# Patient Record
Sex: Male | Born: 1940 | Race: Black or African American | Hispanic: No | Smoking: Former smoker
Health system: Southern US, Community
[De-identification: ages and names within clinical notes are randomized; demographics above are authoritative.]

## PROBLEM LIST (undated history)

## (undated) DIAGNOSIS — M1990 Unspecified osteoarthritis, unspecified site: Secondary | ICD-10-CM

## (undated) DIAGNOSIS — J40 Bronchitis, not specified as acute or chronic: Secondary | ICD-10-CM

## (undated) DIAGNOSIS — J45909 Unspecified asthma, uncomplicated: Secondary | ICD-10-CM

## (undated) DIAGNOSIS — I10 Essential (primary) hypertension: Secondary | ICD-10-CM

## (undated) DIAGNOSIS — C61 Malignant neoplasm of prostate: Secondary | ICD-10-CM

## (undated) DIAGNOSIS — J449 Chronic obstructive pulmonary disease, unspecified: Secondary | ICD-10-CM

## (undated) HISTORY — PX: CARDIAC SURGERY: SHX584

## (undated) HISTORY — PX: FINGER SURGERY: SHX640

---

## 1999-04-23 ENCOUNTER — Emergency Department (HOSPITAL_COMMUNITY): Admission: EM | Admit: 1999-04-23 | Discharge: 1999-04-23 | Payer: Self-pay | Admitting: Emergency Medicine

## 1999-05-02 ENCOUNTER — Emergency Department (HOSPITAL_COMMUNITY): Admission: EM | Admit: 1999-05-02 | Discharge: 1999-05-02 | Payer: Self-pay | Admitting: Emergency Medicine

## 2000-12-31 ENCOUNTER — Emergency Department (HOSPITAL_COMMUNITY): Admission: EM | Admit: 2000-12-31 | Discharge: 2001-01-01 | Payer: Self-pay | Admitting: Emergency Medicine

## 2001-01-01 ENCOUNTER — Encounter: Payer: Self-pay | Admitting: Emergency Medicine

## 2001-02-08 ENCOUNTER — Emergency Department (HOSPITAL_COMMUNITY): Admission: EM | Admit: 2001-02-08 | Discharge: 2001-02-09 | Payer: Self-pay | Admitting: Emergency Medicine

## 2001-02-08 ENCOUNTER — Encounter: Payer: Self-pay | Admitting: Emergency Medicine

## 2001-03-13 ENCOUNTER — Emergency Department (HOSPITAL_COMMUNITY): Admission: EM | Admit: 2001-03-13 | Discharge: 2001-03-13 | Payer: Self-pay

## 2002-03-25 ENCOUNTER — Encounter: Payer: Self-pay | Admitting: Emergency Medicine

## 2002-03-26 ENCOUNTER — Inpatient Hospital Stay (HOSPITAL_COMMUNITY): Admission: EM | Admit: 2002-03-26 | Discharge: 2002-03-28 | Payer: Self-pay | Admitting: Emergency Medicine

## 2002-03-26 ENCOUNTER — Encounter: Payer: Self-pay | Admitting: Internal Medicine

## 2002-10-25 ENCOUNTER — Ambulatory Visit (HOSPITAL_COMMUNITY): Admission: RE | Admit: 2002-10-25 | Discharge: 2002-10-25 | Payer: Self-pay | Admitting: Internal Medicine

## 2002-10-25 ENCOUNTER — Encounter: Payer: Self-pay | Admitting: Internal Medicine

## 2006-07-07 ENCOUNTER — Emergency Department (HOSPITAL_COMMUNITY): Admission: EM | Admit: 2006-07-07 | Discharge: 2006-07-07 | Payer: Self-pay | Admitting: Family Medicine

## 2006-09-13 ENCOUNTER — Emergency Department (HOSPITAL_COMMUNITY): Admission: EM | Admit: 2006-09-13 | Discharge: 2006-09-13 | Payer: Self-pay | Admitting: Emergency Medicine

## 2007-09-11 ENCOUNTER — Emergency Department (HOSPITAL_COMMUNITY): Admission: EM | Admit: 2007-09-11 | Discharge: 2007-09-11 | Payer: Self-pay | Admitting: Emergency Medicine
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
## 2010-08-21 NOTE — Discharge Summary (Signed)
NAME:  Anthony Duffy, Anthony Duffy NO.:  [id number]   MEDICAL RECORD NO.:  [id number]                   PATIENT TYPE:  INP   LOCATION:  5032                                 FACILITY:  [facility]   PHYSICIAN:  Rene Paci, M.D. [facility]          DATE OF BIRTH:  Nov 04, 1940   DATE OF ADMISSION:  03/25/2002  DATE OF DISCHARGE:  03/28/2002                                 DISCHARGE SUMMARY   ADMISSION DIAGNOSIS:  Chronic obstructive pulmonary disease exacerbation.   DISCHARGE DIAGNOSIS:  Chronic obstructive pulmonary disease exacerbation and  questionable pneumonia.   DISCHARGE LOCATION:  Prison.   DISCHARGE MEDICATIONS:  1. Prednisone taper: 60 mg on December 25, 26, and 37; 60 mg on December 28,     29, and 30; 40 mg on December 31, January 1, 2; 30 mg on January 3, 4,     and 5; 20 mg on January 6, 7, and 8; 10 mg on January 9, 10, and 11; 10     mg on January 13, and 15, then stop.  2. Ceftin 250 mg 1 p.o. b.i.d. for 5 more days to complete a 7-day course of     antibiotics.  3. He is to use his inhalers that he has at the prison as needed.  4. He is to return to his other home medications which includes verapamil     120 mg 1 p.o. q.d.   LABORATORY DATA:  Chest x-ray on 03/25/2002 showed COPD, no acute  abnormality, heart size and vascularity normal.  Lungs are somewhat  hyperinflated consistent with COPD.   Magnesium 2.1.  BMET on 03/26/2002 normal except for glucose of 120.  CBC on  December 22 normal except for slightly elevated neutrophil count at 89.  Lymphocytes show left shift at 8, and absolute lymphocytes also showing a  left shift at 0.5.   HOSPITAL COURSE:  The patient was placed on IV steroids, antibiotic therapy,  inhalation therapy, and he showed very good response to the above.  He  states that he believes his problems are exacerbated by a probable sick-  building issue where he feels better during the day when he is outside  working; when he  re-enters the building, he begins to cough and feel  congested.  This has progressed to the point where he required hospital.  At  point of discharge, he is much improved.  He is eager to be returning home  since he is on Christmas leave  from the prison.   PULMONARY, CHRONIC OBSTRUCTIVE PULMONARY DISEASE AND ASTHMATIC EXACERBATION:  Improved.  He will be discharged on prednisone taper as listed above,  guaifenesin, and antibiotics.  He can use the inhalers he had at home.   DISCHARGE PHYSICAL EXAMINATION:  VITAL SIGNS:  Temperature 97.4, pulse 91,  respiratory rate 20, blood pressure 120/82, O2 saturation 94%.  GENERAL:  There are no new laboratories  to report.  He is awake, alert, and  oriented x 4.  He is a pleasant man.  HEART:  Regular rate and rhythm.  LUNGS:  No crackles or wheezes.  Speech sounds somewhat nasal.  EXTREMITIES:  No edema.  ABDOMEN:  Flat.  He has good bowel sounds and is nontender.   DISPOSITION:  The patient will be discharged in the care of prison service.  He wishes to continue with [facility] on release from the hospital.  His mother  is a patient in the [place] system.     Governor Specking, N.P. [facility]             Rene Paci, M.D. [facility]    [facility]/MEDQ  D:  03/28/2002  T:  03/29/2002  Job:  [id number]

## 2011-09-21 ENCOUNTER — Emergency Department (INDEPENDENT_AMBULATORY_CARE_PROVIDER_SITE_OTHER)
Admission: EM | Admit: 2011-09-21 | Discharge: 2011-09-21 | Disposition: A | Discharge status: Home / Self Care | Payer: Medicare Other | Source: Home / Self Care | Attending: Emergency Medicine | Admitting: Emergency Medicine

## 2011-09-21 ENCOUNTER — Encounter (HOSPITAL_COMMUNITY): Payer: Self-pay

## 2011-09-21 DIAGNOSIS — J069 Acute upper respiratory infection, unspecified: Secondary | ICD-10-CM

## 2011-09-21 DIAGNOSIS — R030 Elevated blood-pressure reading, without diagnosis of hypertension: Secondary | ICD-10-CM

## 2011-09-21 LAB — POCT I-STAT, CHEM 8
BUN: 13 mg/dL (ref 6–23)
Creatinine, Ser: 1.1 mg/dL (ref 0.50–1.35)
Glucose, Bld: 84 mg/dL (ref 70–99)
Hemoglobin: 15.3 g/dL (ref 13.0–17.0)
Sodium: 141 mEq/L (ref 135–145)
TCO2: 26 mmol/L (ref 0–100)

## 2011-09-21 MED ORDER — CHLORTHALIDONE 25 MG PO TABS: 12.5000 mg | ORAL_TABLET | Freq: Every day | ORAL | Status: DC

## 2011-09-21 NOTE — ED Provider Notes (Signed)
Medical screening examination/treatment/procedure(s) were performed by non-physician practitioner and as supervising physician I was immediately available for consultation/collaboration.  Leslee Home, M.D.   Reuben Likes, MD 09/21/11 2102

## 2011-09-21 NOTE — ED Notes (Signed)
States he has not had an exam in some time, and is concerned about his BP that was elevated at a public use machine in drug store. States he has the sensation of a hair going across his forehead at times and some pressure sensation in the front of his head : NAD; has been told in past that he has cataracts

## 2011-09-21 NOTE — ED Provider Notes (Signed)
History     CSN: [id number]  Arrival date & time 09/21/11  1317   First MD Initiated Contact with Patient 09/21/11 1358      Chief Complaint  Patient presents with  . Headache    (Consider location/radiation/quality/duration/timing/severity/associated sxs/prior treatment) Patient is a 71 y.o. male presenting with headaches. The history is provided by the patient.  Headache The primary symptoms include headaches.  Patient states he checked blood pressure at drug store and noted bp reading to be elevated today.  States he is further concerned because he has had a headache that has been daily for the last three days.  Patient complains of a 3 day history of persistent headache. Character:  Pulsating  Location: frontal Aggravating activities:  none Alleviating activities:  BC powder and ibuprofen No history of injury.  Not worst headache of life.  Does not awaken from sleep.  -radiation, +dizziness, -syncope, -LOC, -diplopia, -loss of vision, -aura, -photophobia. No nausea/vomiting.  No extremity weakness, numbness, or paresthesias.  Wears glasses for known decreased visual acuity as result of cataracts.  Also reports nasal congestion in addition to intermittent cough.  History reviewed. No pertinent past medical history.  History reviewed. No pertinent past surgical history.  History reviewed. No pertinent family history.  History  Substance Use Topics  . Smoking status: Never Smoker   . Smokeless tobacco: Not on file  . Alcohol Use: No      Review of Systems  Neurological: Positive for headaches.  All other systems reviewed and are negative.    Allergies  Review of patient's allergies indicates no known allergies.  Home Medications   Current Outpatient Rx  Name Route Sig Dispense Refill  . CHLORTHALIDONE 25 MG PO TABS Oral Take 0.5 tablets (12.5 mg total) by mouth daily. 30 tablet 0    BP 154/71  Pulse 58  Temp 98.6 F (37 C) (Oral)  Resp 18  SpO2  99%  Physical Exam  Nursing note and vitals reviewed. Constitutional: He is oriented to person, place, and time. Vital signs are normal. He appears well-developed and well-nourished. He is active and cooperative.  Non-toxic appearance. He does not appear ill.  HENT:  Head: Normocephalic.  Right Ear: Hearing, external ear and ear canal normal. Tympanic membrane is bulging.  Left Ear: Hearing, tympanic membrane, external ear and ear canal normal.  Nose: Nose normal.  Mouth/Throat: Uvula is midline, oropharynx is clear and moist and mucous membranes are normal.  Eyes: Conjunctivae and EOM are normal. Pupils are equal, round, and reactive to light. No scleral icterus.  Neck: Trachea normal. Neck supple.  Cardiovascular: Normal rate, regular rhythm and normal heart sounds.   Pulmonary/Chest: Effort normal and breath sounds normal.  Lymphadenopathy:       Head (right side): No submental, no submandibular, no tonsillar, no preauricular, no posterior auricular and no occipital adenopathy present.       Head (left side): No submental, no submandibular, no tonsillar, no preauricular, no posterior auricular and no occipital adenopathy present.  Neurological: He is alert and oriented to person, place, and time. He has normal strength. No cranial nerve deficit or sensory deficit.  Skin: Skin is warm and dry.  Psychiatric: He has a normal mood and affect. His speech is normal and behavior is normal. Judgment and thought content normal. Cognition and memory are normal.    ED Course  Procedures (including critical care time)   Labs Reviewed  POCT I-STAT, CHEM 8   No results found.  1. Elevated blood pressure reading   2. URI (upper respiratory infection)       MDM  Follow up with primary care provider within two weeks to recheck your blood pressure, begin medication as prescribed.  Avoid adding sodium to food. You may use Claritin or Zyrtec for URI symtpoms.  RTC if you begin experiencing  chest pain, sob, nausea, or worsing headache.       Johnsie Kindred, NP 09/21/11 [phone]

## 2011-09-21 NOTE — Discharge Instructions (Signed)
Hypertension Information As your heart beats, it forces blood through your arteries. This force is your blood pressure. If the pressure is too high, it is called hypertension (HTN) or high blood pressure. HTN is dangerous because you may have it and not know it. High blood pressure may mean that your heart has to work harder to pump blood. Your arteries may be narrow or stiff. The extra work puts you at risk for heart disease, stroke, and other problems.  Blood pressure consists of two numbers, a higher number over a lower, 110/72, for example. It is stated as "110 over 72." The ideal is below 120 for the top number (systolic) and under 80 for the bottom (diastolic).  You should pay close attention to your blood pressure if you have certain conditions such as:  Heart failure.   Prior heart attack.   Diabetes   Chronic kidney disease.   Prior stroke.   Multiple risk factors for heart disease.  To see if you have HTN, your blood pressure should be measured while you are seated with your arm held at the level of the heart. It should be measured at least twice. A one-time elevated blood pressure reading (especially in the Emergency Department) does not mean that you need treatment. There may be conditions in which the blood pressure is different between your right and left arms. It is important to see your caregiver soon for a recheck. Most people have essential hypertension which means that there is not a specific cause. This type of high blood pressure may be lowered by changing lifestyle factors such as:  Stress.   Smoking.   Lack of exercise.   Excessive weight.   Drug/tobacco/alcohol use.   Eating less salt.  Most people do not have symptoms from high blood pressure until it has caused damage to the body. Effective treatment can often prevent, delay or reduce that damage. TREATMENT  Treatment for high blood pressure, when a cause has been identified, is directed at the cause. There  are a large number of medications to treat HTN. These fall into several categories, and your caregiver will help you select the medicines that are best for you. Medications may have side effects. You should review side effects with your caregiver. If your blood pressure stays high after you have made lifestyle changes or started on medicines,   Your medication(s) may need to be changed.   Other problems may need to be addressed.   Be certain you understand your prescriptions, and know how and when to take your medicine.   Be sure to follow up with your caregiver within the time frame advised (usually within two weeks) to have your blood pressure rechecked and to review your medications.   If you are taking more than one medicine to lower your blood pressure, make sure you know how and at what times they should be taken. Taking two medicines at the same time can result in blood pressure that is too low.  Document Released: 05/25/2005 Document Revised: 12/02/2010 Document Reviewed: 06/01/2007 [facility] Patient Information 2012 [place], [place].  Upper Respiratory Infection, Adult An upper respiratory infection (URI) is also sometimes known as the common cold. The upper respiratory tract includes the nose, sinuses, throat, trachea, and bronchi. Bronchi are the airways leading to the lungs. Most people improve within 1 week, but symptoms can last up to 2 weeks. A residual cough may last even longer.  CAUSES Many different viruses can infect the tissues lining the upper  respiratory tract. The tissues become irritated and inflamed and often become very moist. Mucus production is also common. A cold is contagious. You can easily spread the virus to others by oral contact. This includes kissing, sharing a glass, coughing, or sneezing. Touching your mouth or nose and then touching a surface, which is then touched by another person, can also spread the virus. SYMPTOMS  Symptoms typically develop 1 to 3 days  after you come in contact with a cold virus. Symptoms vary from person to person. They may include:  Runny nose.   Sneezing.   Nasal congestion.   Sinus irritation.   Sore throat.   Loss of voice (laryngitis).   Cough.   Fatigue.   Muscle aches.   Loss of appetite.   Headache.   Low-grade fever.  DIAGNOSIS  You might diagnose your own cold based on familiar symptoms, since most people get a cold 2 to 3 times a year. Your caregiver can confirm this based on your exam. Most importantly, your caregiver can check that your symptoms are not due to another disease such as strep throat, sinusitis, pneumonia, asthma, or epiglottitis. Blood tests, throat tests, and X-rays are not necessary to diagnose a common cold, but they may sometimes be helpful in excluding other more serious diseases. Your caregiver will decide if any further tests are required. RISKS AND COMPLICATIONS  You may be at risk for a more severe case of the common cold if you smoke cigarettes, have chronic heart disease (such as heart failure) or lung disease (such as asthma), or if you have a weakened immune system. The very young and very old are also at risk for more serious infections. Bacterial sinusitis, middle ear infections, and bacterial pneumonia can complicate the common cold. The common cold can worsen asthma and chronic obstructive pulmonary disease (COPD). Sometimes, these complications can require emergency medical care and may be life-threatening. PREVENTION  The best way to protect against getting a cold is to practice good hygiene. Avoid oral or hand contact with people with cold symptoms. Wash your hands often if contact occurs. There is no clear evidence that vitamin C, vitamin E, echinacea, or exercise reduces the chance of developing a cold. However, it is always recommended to get plenty of rest and practice good nutrition. TREATMENT  Treatment is directed at relieving symptoms. There is no cure.  Antibiotics are not effective, because the infection is caused by a virus, not by bacteria. Treatment may include:  Increased fluid intake. Sports drinks offer valuable electrolytes, sugars, and fluids.   Breathing heated mist or steam (vaporizer or shower).   Eating chicken soup or other clear broths, and maintaining good nutrition.   Getting plenty of rest.   Using gargles or lozenges for comfort.   Controlling fevers with ibuprofen or acetaminophen as directed by your caregiver.   Increasing usage of your inhaler if you have asthma.  Zinc gel and zinc lozenges, taken in the first 24 hours of the common cold, can shorten the duration and lessen the severity of symptoms. Pain medicines may help with fever, muscle aches, and throat pain. A variety of non-prescription medicines are available to treat congestion and runny nose. Your caregiver can make recommendations and may suggest nasal or lung inhalers for other symptoms.  HOME CARE INSTRUCTIONS   Only take over-the-counter or prescription medicines for pain, discomfort, or fever as directed by your caregiver.   Use a warm mist humidifier or inhale steam from a shower to increase  air moisture. This may keep secretions moist and make it easier to breathe.   Drink enough water and fluids to keep your urine clear or pale yellow.   Rest as needed.   Return to work when your temperature has returned to normal or as your caregiver advises. You may need to stay home longer to avoid infecting others. You can also use a face mask and careful hand washing to prevent spread of the virus.  SEEK MEDICAL CARE IF:   After the first few days, you feel you are getting worse rather than better.   You need your caregiver's advice about medicines to control symptoms.   You develop chills, worsening shortness of breath, or brown or red sputum. These may be signs of pneumonia.   You develop yellow or brown nasal discharge or pain in the face,  especially when you bend forward. These may be signs of sinusitis.   You develop a fever, swollen neck glands, pain with swallowing, or white areas in the back of your throat. These may be signs of strep throat.  SEEK IMMEDIATE MEDICAL CARE IF:   You have a fever.   You develop severe or persistent headache, ear pain, sinus pain, or chest pain.   You develop wheezing, a prolonged cough, cough up blood, or have a change in your usual mucus (if you have chronic lung disease).   You develop sore muscles or a stiff neck.  Document Released: 09/15/2000 Document Revised: 03/11/2011 Document Reviewed: 07/24/2010 [facility] Patient Information 2012 [place], [place].Upper Respiratory Infection, Adult An upper respiratory infection (URI) is also sometimes known as the common cold. The upper respiratory tract includes the nose, sinuses, throat, trachea, and bronchi. Bronchi are the airways leading to the lungs. Most people improve within 1 week, but symptoms can last up to 2 weeks. A residual cough may last even longer.  CAUSES Many different viruses can infect the tissues lining the upper respiratory tract. The tissues become irritated and inflamed and often become very moist. Mucus production is also common. A cold is contagious. You can easily spread the virus to others by oral contact. This includes kissing, sharing a glass, coughing, or sneezing. Touching your mouth or nose and then touching a surface, which is then touched by another person, can also spread the virus. SYMPTOMS  Symptoms typically develop 1 to 3 days after you come in contact with a cold virus. Symptoms vary from person to person. They may include:  Runny nose.   Sneezing.   Nasal congestion.   Sinus irritation.   Sore throat.   Loss of voice (laryngitis).   Cough.   Fatigue.   Muscle aches.   Loss of appetite.   Headache.   Low-grade fever.  DIAGNOSIS  You might diagnose your own cold based on familiar symptoms,  since most people get a cold 2 to 3 times a year. Your caregiver can confirm this based on your exam. Most importantly, your caregiver can check that your symptoms are not due to another disease such as strep throat, sinusitis, pneumonia, asthma, or epiglottitis. Blood tests, throat tests, and X-rays are not necessary to diagnose a common cold, but they may sometimes be helpful in excluding other more serious diseases. Your caregiver will decide if any further tests are required. RISKS AND COMPLICATIONS  You may be at risk for a more severe case of the common cold if you smoke cigarettes, have chronic heart disease (such as heart failure) or lung disease (such as asthma),  or if you have a weakened immune system. The very young and very old are also at risk for more serious infections. Bacterial sinusitis, middle ear infections, and bacterial pneumonia can complicate the common cold. The common cold can worsen asthma and chronic obstructive pulmonary disease (COPD). Sometimes, these complications can require emergency medical care and may be life-threatening. PREVENTION  The best way to protect against getting a cold is to practice good hygiene. Avoid oral or hand contact with people with cold symptoms. Wash your hands often if contact occurs. There is no clear evidence that vitamin C, vitamin E, echinacea, or exercise reduces the chance of developing a cold. However, it is always recommended to get plenty of rest and practice good nutrition. TREATMENT  Treatment is directed at relieving symptoms. There is no cure. Antibiotics are not effective, because the infection is caused by a virus, not by bacteria. Treatment may include:  Increased fluid intake. Sports drinks offer valuable electrolytes, sugars, and fluids.   Breathing heated mist or steam (vaporizer or shower).   Eating chicken soup or other clear broths, and maintaining good nutrition.   Getting plenty of rest.   Using gargles or lozenges for  comfort.   Controlling fevers with ibuprofen or acetaminophen as directed by your caregiver.   Increasing usage of your inhaler if you have asthma.  Zinc gel and zinc lozenges, taken in the first 24 hours of the common cold, can shorten the duration and lessen the severity of symptoms. Pain medicines may help with fever, muscle aches, and throat pain. A variety of non-prescription medicines are available to treat congestion and runny nose. Your caregiver can make recommendations and may suggest nasal or lung inhalers for other symptoms.  HOME CARE INSTRUCTIONS   Only take over-the-counter or prescription medicines for pain, discomfort, or fever as directed by your caregiver.   Use a warm mist humidifier or inhale steam from a shower to increase air moisture. This may keep secretions moist and make it easier to breathe.   Drink enough water and fluids to keep your urine clear or pale yellow.   Rest as needed.   Return to work when your temperature has returned to normal or as your caregiver advises. You may need to stay home longer to avoid infecting others. You can also use a face mask and careful hand washing to prevent spread of the virus.  SEEK MEDICAL CARE IF:   After the first few days, you feel you are getting worse rather than better.   You need your caregiver's advice about medicines to control symptoms.   You develop chills, worsening shortness of breath, or brown or red sputum. These may be signs of pneumonia.   You develop yellow or brown nasal discharge or pain in the face, especially when you bend forward. These may be signs of sinusitis.   You develop a fever, swollen neck glands, pain with swallowing, or white areas in the back of your throat. These may be signs of strep throat.  SEEK IMMEDIATE MEDICAL CARE IF:   You have a fever.   You develop severe or persistent headache, ear pain, sinus pain, or chest pain.   You develop wheezing, a prolonged cough, cough up  blood, or have a change in your usual mucus (if you have chronic lung disease).   You develop sore muscles or a stiff neck.  Document Released: 09/15/2000 Document Revised: 03/11/2011 Document Reviewed: 07/24/2010 [facility] Patient Information 2012 [place], [place].

## 2011-11-22 ENCOUNTER — Emergency Department (INDEPENDENT_AMBULATORY_CARE_PROVIDER_SITE_OTHER): Payer: Medicare Other

## 2011-11-22 ENCOUNTER — Emergency Department (INDEPENDENT_AMBULATORY_CARE_PROVIDER_SITE_OTHER)
Admission: EM | Admit: 2011-11-22 | Discharge: 2011-11-22 | Disposition: A | Discharge status: Home / Self Care | Payer: Medicare Other | Source: Home / Self Care

## 2011-11-22 ENCOUNTER — Encounter (HOSPITAL_COMMUNITY): Payer: Self-pay

## 2011-11-22 DIAGNOSIS — M25572 Pain in left ankle and joints of left foot: Secondary | ICD-10-CM

## 2011-11-22 DIAGNOSIS — M25579 Pain in unspecified ankle and joints of unspecified foot: Secondary | ICD-10-CM

## 2011-11-22 HISTORY — DX: Essential (primary) hypertension: I10

## 2011-11-22 MED ORDER — CHLORTHALIDONE 25 MG PO TABS: 12.5000 mg | ORAL_TABLET | Freq: Every day | ORAL | Status: DC
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
## 2011-12-20 ENCOUNTER — Other Ambulatory Visit: Payer: Self-pay | Admitting: Ophthalmology

## 2011-12-20 NOTE — H&P (Signed)
  Pre-operative History and Physical for Ophthalmic Surgery  Anthony Duffy 12/20/2011                  Chief Complaint: Decreased vision Right Eye  Diagnosis: Combioned Cataract Right Eye  No Known Allergies   Prior to Admission medications   Medication Sig Start Date End Date Taking? Authorizing Provider  chlorthalidone (HYGROTON) 25 MG tablet Take 0.5 tablets (12.5 mg total) by mouth daily. 11/22/11 11/21/12  Jai-Gurmukh Samtani, MD    Planned Procedure:                                       Phacoemulsification, Posterior Chamber Intra-ocular Lens Right Eye  There were no vitals filed for this visit.  Pulse: 70         Temp: NE        Resp:  18       ROS: negative apart form blurred vision OD  Past Medical History  Diagnosis Date  . Hypertension     No past surgical history on file.   History   Social History  . Marital Status: Legally Separated    Spouse Name: N/A    Number of Children: N/A  . Years of Education: N/A   Occupational History  . Not on file.   Social History Main Topics  . Smoking status: Never Smoker   . Smokeless tobacco: Not on file  . Alcohol Use: No  . Drug Use: No  . Sexually Active:    Other Topics Concern  . Not on file   Social History Narrative  . No narrative on file     The following examination is for anesthesia clearance for minimally invasive Ophthalmic surgery. It is primarily to document heart and lung findings and is not intended to elucidate unknown general medical conditions inclusive of abdominal masses, lung lesions, etc.   General Constitution:  within normal limits   Alertness/Orientation:  Person, time place     yes   HEENT:  Eye Findings:  Combined Cataract                   right eye  Neck: supple without masses  Chest/Lungs: clear to auscultation  Cardiac: Normal S1 and S2 without Murmur, S3 or S4  Neuro: non-focal  Impression:  Combined Cataract Right Eye  Planned Procedure:  Phacoemulsification,  Posterior Chamber Intraocular Lens Right Eye   Amaliya Whitelaw, MD        

## 2011-12-28 NOTE — Pre-Procedure Instructions (Signed)
20 Anthony Duffy  12/28/2011   Your procedure is scheduled on:  Wednesday 01/05/12   Report to [facility] at 1230 PM.  Call this number if you have problems the morning of surgery: [phone]   Remember:   Do not eat food OR DRINK :After Midnight.    Take these medicines the morning of surgery with A SIP OF WATER: no medications   Do not wear jewelry, make-up or nail polish.  Do not wear lotions, powders, or perfumes.  Do not shave 48 hours prior to surgery. Men may shave face and neck.  Do not bring valuables to the hospital.  Contacts, dentures or bridgework may not be worn into surgery.  Leave suitcase in the car. After surgery it may be brought to your room.  For patients admitted to the hospital, checkout time is 11:00 AM the day of discharge.   Patients discharged the day of surgery will not be allowed to drive home.  Name and phone number of your driver: Doyce Loose  Special Instructions: Shower using CHG 2 nights before surgery and the night before surgery.  If you shower the day of surgery use CHG.  Use special wash - you have one bottle of CHG for all showers.  You should use approximately 1/3 of the bottle for each shower.   Please read over the following fact sheets that you were given: Pain Booklet, Coughing and Deep Breathing, MRSA Information and Surgical Site Infection Prevention

## 2011-12-29 ENCOUNTER — Encounter (HOSPITAL_COMMUNITY): Payer: Self-pay

## 2011-12-29 ENCOUNTER — Encounter (HOSPITAL_COMMUNITY)
Admission: RE | Admit: 2011-12-29 | Discharge: 2011-12-29 | Disposition: A | Discharge status: Home / Self Care | Payer: Medicare Other | Source: Ambulatory Visit | Attending: Ophthalmology | Admitting: Ophthalmology

## 2011-12-29 ENCOUNTER — Emergency Department (INDEPENDENT_AMBULATORY_CARE_PROVIDER_SITE_OTHER)
Admission: EM | Admit: 2011-12-29 | Discharge: 2011-12-29 | Disposition: A | Discharge status: Home / Self Care | Payer: Medicare Other | Source: Home / Self Care

## 2011-12-29 ENCOUNTER — Encounter (HOSPITAL_COMMUNITY): Payer: Self-pay | Admitting: Emergency Medicine

## 2011-12-29 DIAGNOSIS — J069 Acute upper respiratory infection, unspecified: Secondary | ICD-10-CM

## 2011-12-29 DIAGNOSIS — B360 Pityriasis versicolor: Secondary | ICD-10-CM

## 2011-12-29 DIAGNOSIS — J9801 Acute bronchospasm: Secondary | ICD-10-CM

## 2011-12-29 HISTORY — DX: Unspecified asthma, uncomplicated: J45.909

## 2011-12-29 HISTORY — DX: Bronchitis, not specified as acute or chronic: J40

## 2011-12-29 HISTORY — DX: Unspecified osteoarthritis, unspecified site: M19.90

## 2011-12-29 LAB — BASIC METABOLIC PANEL
BUN: 19 mg/dL (ref 6–23)
Creatinine, Ser: 1 mg/dL (ref 0.50–1.35)
GFR calc non Af Amer: 74 mL/min — ABNORMAL LOW (ref >90)
Glucose, Bld: 109 mg/dL — ABNORMAL HIGH (ref 70–99)
Potassium: 4.1 mEq/L (ref 3.5–5.1)

## 2011-12-29 LAB — CBC
HCT: 44.5 % (ref 39.0–52.0)
Hemoglobin: 14.9 g/dL (ref 13.0–17.0)
MCH: 28.2 pg (ref 26.0–34.0)
MCHC: 33.5 g/dL (ref 30.0–36.0)
MCV: 84.3 fL (ref 78.0–100.0)
Platelets: 286 10*3/uL (ref 150–400)
RBC: 5.28 MIL/uL (ref 4.22–5.81)
RDW: 14 % (ref 11.5–15.5)
WBC: 7.5 10*3/uL (ref 4.0–10.5)

## 2011-12-29 MED ORDER — TRIAMCINOLONE ACETONIDE 40 MG/ML IJ SUSP
40.0000 mg | Freq: Once | INTRAMUSCULAR | Status: AC
  Administered 2011-12-29: 40 mg via INTRAMUSCULAR

## 2011-12-29 MED ORDER — ALBUTEROL SULFATE (5 MG/ML) 0.5% IN NEBU
2.5000 mg | INHALATION_SOLUTION | Freq: Once | RESPIRATORY_TRACT | Status: AC
  Administered 2011-12-29: 2.5 mg via RESPIRATORY_TRACT

## 2011-12-29 MED ORDER — IPRATROPIUM BROMIDE 0.02 % IN SOLN
0.5000 mg | Freq: Once | RESPIRATORY_TRACT | Status: AC
  Administered 2011-12-29: 0.5 mg via RESPIRATORY_TRACT

## 2011-12-29 MED ORDER — ALBUTEROL SULFATE (2.5 MG/3ML) 0.083% IN NEBU: 2.5000 mg | INHALATION_SOLUTION | Freq: Four times a day (QID) | RESPIRATORY_TRACT | Status: DC | PRN

## 2011-12-29 MED ORDER — PHENYLEPHRINE-CHLORPHEN-DM 10-4-12.5 MG/5ML PO LIQD: 5.0000 mL | ORAL | Status: DC | PRN

## 2011-12-29 MED ORDER — TRIAMCINOLONE ACETONIDE 40 MG/ML IJ SUSP
INTRAMUSCULAR | Status: AC
  Filled 2011-12-29: qty 5

## 2011-12-29 MED ORDER — SELENIUM SULFIDE 2.5 % EX LOTN: TOPICAL_LOTION | CUTANEOUS | Status: DC

## 2011-12-29 MED ORDER — METHYLPREDNISOLONE 4 MG PO KIT: PACK | ORAL | Status: DC

## 2011-12-29 MED ORDER — ALBUTEROL SULFATE (5 MG/ML) 0.5% IN NEBU
INHALATION_SOLUTION | RESPIRATORY_TRACT | Status: AC
  Filled 2011-12-29: qty 1

## 2011-12-29 MED ORDER — ALBUTEROL SULFATE HFA 108 (90 BASE) MCG/ACT IN AERS: 2.0000 | INHALATION_SPRAY | RESPIRATORY_TRACT | Status: DC | PRN

## 2011-12-29 NOTE — ED Provider Notes (Signed)
History     CSN: [id number]  Arrival date & time 12/29/11  0907   None     Chief Complaint  Patient presents with  . URI    (Consider location/radiation/quality/duration/timing/severity/associated sxs/prior treatment) HPI Comments: Is oscillating the room he also pointed out type of rash on his left upper extremity and back. He has had this for several years off and on. These are deep pigmented annular flat lesions consistent with tinea versicolor  Patient is a 71 y.o. male presenting with URI. The history is provided by the patient.  URI The primary symptoms include fever, cough and wheezing. Primary symptoms do not include headaches, ear pain, sore throat, swollen glands, abdominal pain, nausea, vomiting, myalgias, arthralgias or rash. The current episode started 3 to 5 days ago. The problem has not changed since onset. Symptoms associated with the illness include congestion and rhinorrhea. The illness is not associated with chills or plugged ear sensation. Risk factors for severe complications from URI include being elderly and chronic respiratory disease.    Past Medical History  Diagnosis Date  . Hypertension   . Asthma     as child  . Bronchitis   . Arthritis     Past Surgical History  Procedure Date  . Finger surgery     s/p injury 30 years ago    No family history on file.  History  Substance Use Topics  . Smoking status: Former Games developer  . Smokeless tobacco: Not on file   Comment: stopped 15 years ago  . Alcohol Use: No     stopped 15 years ago      Review of Systems  Constitutional: Positive for fever. Negative for chills.  HENT: Positive for congestion and rhinorrhea. Negative for ear pain and sore throat.   Respiratory: Positive for cough and wheezing.   Gastrointestinal: Negative.  Negative for nausea, vomiting and abdominal pain.  Genitourinary: Negative.   Musculoskeletal: Negative for myalgias and arthralgias.  Skin: Negative for rash.    Neurological: Negative for headaches.    Allergies  Review of patient's allergies indicates no known allergies.  Home Medications   Current Outpatient Rx  Name Route Sig Dispense Refill  . CHLORTHALIDONE 25 MG PO TABS Oral Take 0.5 tablets (12.5 mg total) by mouth daily. 30 tablet 1  . OVER THE COUNTER MEDICATION  Cough medicine    . ALBUTEROL SULFATE HFA 108 (90 BASE) MCG/ACT IN AERS Inhalation Inhale 2 puffs into the lungs every 4 (four) hours as needed for wheezing. 1 Inhaler 1  . ALBUTEROL SULFATE (2.5 MG/3ML) 0.083% IN NEBU Nebulization Take 3 mLs (2.5 mg total) by nebulization every 6 (six) hours as needed for wheezing. 75 mL 12  . METHYLPREDNISOLONE 4 MG PO KIT  Take as directed 21 tablet 0  . PHENYLEPHRINE-CHLORPHEN-DM 01-07-11.5 MG/5ML PO LIQD Oral Take 5 mLs by mouth every other day as needed. 120 mL 0  . SELENIUM SULFIDE 2.5 % EX LOTN Topical Apply topically 2 (two) times a week. 120 mL 1    BP 134/81  Pulse 76  Temp 98.1 F (36.7 C) (Oral)  Resp 16  SpO2 96%  Physical Exam  Constitutional: He is oriented to person, place, and time. He appears well-developed and well-nourished. No distress.  HENT:  Right Ear: External ear normal.  Left Ear: External ear normal.  Mouth/Throat: Oropharynx is clear and moist. No oropharyngeal exudate.       Clear PND  Eyes: Conjunctivae normal and EOM are normal. Pupils  are equal, round, and reactive to light.  Neck: Normal range of motion. Neck supple.  Cardiovascular: Normal rate and regular rhythm.   Pulmonary/Chest: Effort normal. No respiratory distress. He has wheezes.       Tight wheezes bilaterally with prolonged expiratory phase  Musculoskeletal: Normal range of motion. He exhibits no edema.  Lymphadenopathy:    He has no cervical adenopathy.  Neurological: He is alert and oriented to person, place, and time.  Skin: Skin is warm and dry. Rash noted.  Psychiatric: He has a normal mood and affect.    ED Course   Procedures (including critical care time)  Labs Reviewed - No data to display Dg Chest 2 View  12/29/2011  *RADIOLOGY REPORT*  Clinical Data: Preoperative examination (cataract surgery)  CHEST - 2 VIEW  Comparison: None available.  Findings:  Normal cardiac silhouette and mediastinal contours. The lungs appear hyperinflated with flattening of bilateral hemidiaphragms. Minimal heterogeneous opacities within the right middle lobe, possibly atelectasis or scar.  There is mild eventration of the medial aspect of the right hemidiaphragm.  No pleural effusion or pneumothorax.  There is apparent loss/obliteration of the T7 - T8 intervertebral disc space of uncertain chronicity.  IMPRESSION: 1.  Hyperexpanded lungs without acute cardiopulmonary disease. 2.  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 Comparison to remote examinations (if available) is recommended.  Correlation for point tenderness at this location is recommended.  Further evaluation with dedicated thoracic spine radiographs may be performed as clinically indicated.  This was made a call report.   Original Report Authenticated By: Waynard Reeds, M.D.      1. URI (upper respiratory infection)   2. Bronchospasm   3. Tinea versicolor       MDM   Medrol Dosepak as directed .Albuterol HFA 2 puffs every 4-6 hours when necessary  cough or wheeze. -year-old solution for his nebulizer. Selenium sulfide to apply 2 times a week. Norell CS 1 teaspoon every 4 hours when necessary cough and Congestion           Hayden Rasmussen, NP 12/29/11 1137

## 2011-12-29 NOTE — Progress Notes (Signed)
Forwarded EKG and CXR to [facility] for review.  Patient states does not have a primary care physician.  Unable to obtain an EKG for comparison.

## 2011-12-29 NOTE — ED Notes (Signed)
Uri symptoms: cough, congestion, stuffy nose

## 2011-12-29 NOTE — ED Notes (Signed)
States he feels better after the [facility] treatment

## 2011-12-29 NOTE — ED Notes (Signed)
Onalee Hua, np at bedside assessing patient

## 2011-12-30 NOTE — Consult Note (Signed)
Anesthesia chart review: Patient is a 71 year old male scheduled for right eye cataract extraction on 01/05/2012 by Dr. Clarisa Kindred. History includes former smoker, hypertension, arthritis, asthma, bronchitis.  Currently, he is receiving "primary care" through the ED or urgent care.   Labs noted.  CXR report on 12/29/11 showed: "1. Hyperexpanded lungs without acute cardiopulmonary disease.  2. 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 Comparison to remote examinations (if available) is recommended. Correlation for point tenderness at this location is recommended. Further evaluation with dedicated thoracic spine radiographs may be performed as clinically indicated. This was made a call report." (Dr. Simonne Come) Defer further followo-up recommendations to the ordering physician (Dr. Clarisa Kindred).  EKG on 12/29/11 showed NSR with sinus arrhythmia, cannot rule out anterior infarct, age undetermined (r wave progression is reversed in V2 and V3), borderline LAD.  Currently, there are no comparison EKGs available.  No CV symptoms were documented at his PAT visit.  Reviewed with Anesthesiologist Dr. Ivin Booty who agrees if patient remains asymptomatic then plan to proceed.  Shonna Chock, PA-C

## 2011-12-30 NOTE — ED Provider Notes (Signed)
Medical screening examination/treatment/procedure(s) were performed by non-physician practitioner and as supervising physician I was immediately available for consultation/collaboration.   [facility]; MD   Sharin Grave, MD 12/30/11 1227

## 2012-01-04 NOTE — Progress Notes (Signed)
I spoke with patient and informed him of schesule change - pt to arrive at 0915.  Pt repeated time to me.

## 2012-01-04 NOTE — Progress Notes (Signed)
Spoke with patient and notified him to be here at 10:30 am instead of 12:30 pm.

## 2012-01-05 ENCOUNTER — Ambulatory Visit (HOSPITAL_COMMUNITY): Payer: Medicare Other | Admitting: Vascular Surgery

## 2012-01-05 ENCOUNTER — Encounter (HOSPITAL_COMMUNITY): Payer: Self-pay | Admitting: Vascular Surgery

## 2012-01-05 ENCOUNTER — Encounter (HOSPITAL_COMMUNITY)
Admission: RE | Disposition: A | Discharge status: Home / Self Care | Payer: Self-pay | Source: Ambulatory Visit | Attending: Ophthalmology

## 2012-01-05 ENCOUNTER — Encounter (HOSPITAL_COMMUNITY): Payer: Self-pay | Admitting: *Deleted

## 2012-01-05 ENCOUNTER — Ambulatory Visit (HOSPITAL_COMMUNITY)
Admission: RE | Admit: 2012-01-05 | Discharge: 2012-01-05 | Disposition: A | Discharge status: Home / Self Care | Payer: Medicare Other | Source: Ambulatory Visit | Attending: Ophthalmology | Admitting: Ophthalmology

## 2012-01-05 DIAGNOSIS — H2510 Age-related nuclear cataract, unspecified eye: Secondary | ICD-10-CM | POA: Insufficient documentation

## 2012-01-05 DIAGNOSIS — Z01818 Encounter for other preprocedural examination: Secondary | ICD-10-CM | POA: Insufficient documentation

## 2012-01-05 DIAGNOSIS — Z01812 Encounter for preprocedural laboratory examination: Secondary | ICD-10-CM | POA: Insufficient documentation

## 2012-01-05 DIAGNOSIS — Z01810 Encounter for preprocedural cardiovascular examination: Secondary | ICD-10-CM | POA: Insufficient documentation

## 2012-01-05 DIAGNOSIS — I10 Essential (primary) hypertension: Secondary | ICD-10-CM | POA: Insufficient documentation

## 2012-01-05 HISTORY — PX: CATARACT EXTRACTION W/PHACO: SHX586

## 2012-01-05 SURGERY — PHACOEMULSIFICATION, CATARACT, WITH IOL INSERTION
Anesthesia: Monitor Anesthesia Care | Site: Eye | Laterality: Right | Wound class: Clean

## 2012-01-05 MED ORDER — ACETYLCHOLINE CHLORIDE 1:100 IO SOLR
INTRAOCULAR | Status: AC
  Filled 2012-01-05: qty 1

## 2012-01-05 MED ORDER — SODIUM CHLORIDE 0.9 % IV SOLN
INTRAVENOUS | Status: DC
  Administered 2012-01-05: 11:00:00 via INTRAVENOUS

## 2012-01-05 MED ORDER — BSS IO SOLN
INTRAOCULAR | Status: AC
  Filled 2012-01-05: qty 500

## 2012-01-05 MED ORDER — CEFAZOLIN SUBCONJUNCTIVAL INJECTION 100 MG/0.5 ML
200.0000 mg | INJECTION | SUBCONJUNCTIVAL | Status: AC
  Administered 2012-01-05: 200 mg via SUBCONJUNCTIVAL
  Filled 2012-01-05: qty 1

## 2012-01-05 MED ORDER — GATIFLOXACIN 0.5 % OP SOLN
1.0000 [drp] | OPHTHALMIC | Status: AC | PRN
  Administered 2012-01-05 (×3): 1 [drp] via OPHTHALMIC
  Filled 2012-01-05: qty 2.5

## 2012-01-05 MED ORDER — HYPROMELLOSE (GONIOSCOPIC) 2.5 % OP SOLN
OPHTHALMIC | Status: AC
  Filled 2012-01-05: qty 15

## 2012-01-05 MED ORDER — LIDOCAINE HCL (CARDIAC) 20 MG/ML IV SOLN
INTRAVENOUS | Status: DC | PRN
  Administered 2012-01-05: 20 mg via INTRAVENOUS

## 2012-01-05 MED ORDER — PROPOFOL 10 MG/ML IV BOLUS
INTRAVENOUS | Status: DC | PRN
  Administered 2012-01-05: 60 mg via INTRAVENOUS

## 2012-01-05 MED ORDER — DEXAMETHASONE SODIUM PHOSPHATE 10 MG/ML IJ SOLN
INTRAMUSCULAR | Status: DC | PRN
  Administered 2012-01-05: 10 mg via INTRAVENOUS

## 2012-01-05 MED ORDER — EPINEPHRINE HCL 1 MG/ML IJ SOLN
INTRAMUSCULAR | Status: AC
  Filled 2012-01-05: qty 1

## 2012-01-05 MED ORDER — BACITRACIN-POLYMYXIN B 500-10000 UNIT/GM OP OINT
TOPICAL_OINTMENT | OPHTHALMIC | Status: DC | PRN
  Administered 2012-01-05: 1 via OPHTHALMIC

## 2012-01-05 MED ORDER — BUPIVACAINE HCL 0.75 % IJ SOLN
INTRAMUSCULAR | Status: DC | PRN
  Administered 2012-01-05: 10 mL

## 2012-01-05 MED ORDER — ACETYLCHOLINE CHLORIDE 1:100 IO SOLR
INTRAOCULAR | Status: DC | PRN
  Administered 2012-01-05: 20 mg via INTRAOCULAR

## 2012-01-05 MED ORDER — TETRACAINE HCL 0.5 % OP SOLN
1.0000 [drp] | OPHTHALMIC | Status: AC
  Administered 2012-01-05: 1 [drp] via OPHTHALMIC
  Filled 2012-01-05: qty 2

## 2012-01-05 MED ORDER — HYPROMELLOSE (GONIOSCOPIC) 2.5 % OP SOLN
OPHTHALMIC | Status: DC | PRN
  Administered 2012-01-05: 2 [drp] via OPHTHALMIC

## 2012-01-05 MED ORDER — SODIUM HYALURONATE 10 MG/ML IO SOLN
INTRAOCULAR | Status: AC
  Filled 2012-01-05: qty 0.85

## 2012-01-05 MED ORDER — PREDNISOLONE ACETATE 1 % OP SUSP
1.0000 [drp] | OPHTHALMIC | Status: AC
  Administered 2012-01-05: 1 [drp] via OPHTHALMIC
  Filled 2012-01-05: qty 5

## 2012-01-05 MED ORDER — NA CHONDROIT SULF-NA HYALURON 40-30 MG/ML IO SOLN
INTRAOCULAR | Status: DC | PRN
  Administered 2012-01-05 (×2): 0.5 mL via INTRAOCULAR

## 2012-01-05 MED ORDER — PHENYLEPHRINE HCL 2.5 % OP SOLN
1.0000 [drp] | OPHTHALMIC | Status: AC | PRN
  Administered 2012-01-05 (×3): 1 [drp] via OPHTHALMIC
  Filled 2012-01-05: qty 3

## 2012-01-05 MED ORDER — NA CHONDROIT SULF-NA HYALURON 40-30 MG/ML IO SOLN
INTRAOCULAR | Status: AC
  Filled 2012-01-05: qty 0.5

## 2012-01-05 MED ORDER — BALANCED SALT IO SOLN
INTRAOCULAR | Status: DC | PRN
  Administered 2012-01-05: 15 mL via INTRAOCULAR

## 2012-01-05 MED ORDER — WATER FOR IRRIGATION, STERILE IR SOLN
Status: DC | PRN
  Administered 2012-01-05: 1000 mL

## 2012-01-05 MED ORDER — BACITRACIN-POLYMYXIN B 500-10000 UNIT/GM OP OINT
TOPICAL_OINTMENT | OPHTHALMIC | Status: AC
  Filled 2012-01-05: qty 3.5

## 2012-01-05 MED ORDER — TRIAMCINOLONE ACETONIDE 40 MG/ML IJ SUSP
INTRAMUSCULAR | Status: AC
  Filled 2012-01-05: qty 1

## 2012-01-05 MED ORDER — LIDOCAINE HCL 2 % IJ SOLN
INTRAMUSCULAR | Status: AC
  Filled 2012-01-05: qty 20

## 2012-01-05 MED ORDER — EPINEPHRINE HCL 1 MG/ML IJ SOLN
INTRAOCULAR | Status: DC | PRN
  Administered 2012-01-05: 11:00:00

## 2012-01-05 MED ORDER — LIDOCAINE HCL (PF) 2 % IJ SOLN
INTRAMUSCULAR | Status: DC | PRN
  Administered 2012-01-05: 20 mL

## 2012-01-05 MED ORDER — SODIUM CHLORIDE 0.9 % IV SOLN
INTRAVENOUS | Status: DC | PRN
  Administered 2012-01-05: 11:00:00 via INTRAVENOUS

## 2012-01-05 MED ORDER — DEXAMETHASONE SODIUM PHOSPHATE 10 MG/ML IJ SOLN
INTRAMUSCULAR | Status: AC
  Filled 2012-01-05: qty 1

## 2012-01-05 MED ORDER — PROVISC 10 MG/ML IO SOLN
INTRAOCULAR | Status: DC | PRN
  Administered 2012-01-05: .85 mL via INTRAOCULAR

## 2012-01-05 MED ORDER — BUPIVACAINE HCL 0.75 % IJ SOLN
INTRAMUSCULAR | Status: AC
  Filled 2012-01-05: qty 10

## 2012-01-05 SURGICAL SUPPLY — 59 items
APPLICATOR COTTON TIP 6IN STRL (MISCELLANEOUS) ×2 IMPLANT
APPLICATOR DR MATTHEWS STRL (MISCELLANEOUS) ×2 IMPLANT
BAG MINI COLL DRAIN (WOUND CARE) ×2 IMPLANT
BLADE EYE MINI 60D BEAVER (BLADE) IMPLANT
BLADE KERATOME 2.75 (BLADE) ×2 IMPLANT
BLADE STAB KNIFE 15DEG (BLADE) IMPLANT
CANNULA ANTERIOR CHAMBER 27GA (MISCELLANEOUS) IMPLANT
CLOTH BEACON ORANGE TIMEOUT ST (SAFETY) ×2 IMPLANT
DRAPE OPHTHALMIC 77X100 STRL (CUSTOM PROCEDURE TRAY) ×2 IMPLANT
DRAPE POUCH INSTRU U-SHP 10X18 (DRAPES) ×2 IMPLANT
DRSG TEGADERM 4X4.75 (GAUZE/BANDAGES/DRESSINGS) ×2 IMPLANT
FILTER BLUE MILLIPORE (MISCELLANEOUS) IMPLANT
GLOVE BIO SURGEON STRL SZ7.5 (GLOVE) ×2 IMPLANT
GLOVE BIOGEL PI IND STRL 7.5 (GLOVE) ×2 IMPLANT
GLOVE BIOGEL PI INDICATOR 7.5 (GLOVE) ×2
GLOVE SS BIOGEL STRL SZ 6.5 (GLOVE) ×1 IMPLANT
GLOVE SUPERSENSE BIOGEL SZ 6.5 (GLOVE) ×1
GOWN SRG XL XLNG 56XLVL 4 (GOWN DISPOSABLE) ×1 IMPLANT
GOWN STRL NON-REIN LRG LVL3 (GOWN DISPOSABLE) ×4 IMPLANT
GOWN STRL NON-REIN XL XLG LVL4 (GOWN DISPOSABLE) ×1
KIT BASIN OR (CUSTOM PROCEDURE TRAY) ×2 IMPLANT
KIT ROOM TURNOVER OR (KITS) ×2 IMPLANT
KNIFE GRIESHABER SHARP 2.5MM (MISCELLANEOUS) ×2 IMPLANT
LENS INTRAOCULAR MA50BM 20.0 (Intraocular Lens) ×2 IMPLANT
MASK EYE SHIELD (GAUZE/BANDAGES/DRESSINGS) ×2 IMPLANT
NEEDLE 18GX1X1/2 (RX/OR ONLY) (NEEDLE) ×2 IMPLANT
NEEDLE 22X1 1/2 (OR ONLY) (NEEDLE) ×2 IMPLANT
NEEDLE 25GX 5/8IN NON SAFETY (NEEDLE) ×2 IMPLANT
NEEDLE FILTER BLUNT 18X 1/2SAF (NEEDLE) ×1
NEEDLE FILTER BLUNT 18X1 1/2 (NEEDLE) ×1 IMPLANT
NEEDLE HYPO 30X.5 LL (NEEDLE) ×4 IMPLANT
NS IRRIG 1000ML POUR BTL (IV SOLUTION) ×2 IMPLANT
PACK CATARACT CUSTOM (CUSTOM PROCEDURE TRAY) ×2 IMPLANT
PACK CATARACT MCHSCP (PACKS) ×2 IMPLANT
PACK COMBINED CATERACT/VIT 23G (OPHTHALMIC RELATED) IMPLANT
PAD ARMBOARD 7.5X6 YLW CONV (MISCELLANEOUS) ×4 IMPLANT
PAD EYE OVAL STERILE LF (GAUZE/BANDAGES/DRESSINGS) ×2 IMPLANT
PHACO TIP KELMAN 45DEG (TIP) ×2 IMPLANT
PROBE ANTERIOR 20G W/INFUS NDL (MISCELLANEOUS) IMPLANT
ROLLS DENTAL (MISCELLANEOUS) IMPLANT
SHUTTLE MONARCH TYPE A (NEEDLE) ×2 IMPLANT
SOLUTION ANTI FOG 6CC (MISCELLANEOUS) ×2 IMPLANT
SPEAR EYE SURG WECK-CEL (MISCELLANEOUS) ×2 IMPLANT
SUT ETHILON 10-0 CS-B-6CS-B-6 (SUTURE)
SUT ETHILON 5 0 P 3 18 (SUTURE)
SUT ETHILON 9 0 TG140 8 (SUTURE) IMPLANT
SUT NYLON ETHILON 5-0 P-3 1X18 (SUTURE) IMPLANT
SUT PLAIN 6 0 TG1408 (SUTURE) IMPLANT
SUT POLY NON ABSORB 10-0 8 STR (SUTURE) IMPLANT
SUT VICRYL 6 0 S 29 12 (SUTURE) IMPLANT
SUTURE EHLN 10-0 CS-B-6CS-B-6 (SUTURE) IMPLANT
SYR 20CC LL (SYRINGE) IMPLANT
SYR 5ML LL (SYRINGE) IMPLANT
SYR TB 1ML LUER SLIP (SYRINGE) ×2 IMPLANT
SYRINGE 10CC LL (SYRINGE) ×2 IMPLANT
TAPE PAPER MEDFIX 1IN X 10YD (GAUZE/BANDAGES/DRESSINGS) ×2 IMPLANT
TOWEL OR 17X24 6PK STRL BLUE (TOWEL DISPOSABLE) ×4 IMPLANT
WATER STERILE IRR 1000ML POUR (IV SOLUTION) ×2 IMPLANT
WIPE INSTRUMENT VISIWIPE 73X73 (MISCELLANEOUS) ×2 IMPLANT

## 2012-01-05 NOTE — Transfer of Care (Signed)
Immediate Anesthesia Transfer of Care Note  Patient: Anthony Duffy  Procedure(s) Performed: Procedure(s) (LRB) with comments: CATARACT EXTRACTION PHACO AND INTRAOCULAR LENS PLACEMENT (IOC) (Right)  Patient Location: Short Stay  Anesthesia Type: MAC  Level of Consciousness: awake, alert  and oriented  Airway & Oxygen Therapy: Patient Spontanous Breathing  Post-op Assessment: Report given to PACU RN  Post vital signs: Reviewed and stable  Complications: No apparent anesthesia complications

## 2012-01-05 NOTE — Anesthesia Postprocedure Evaluation (Signed)
  Anesthesia Post-op Note  Patient: Anthony Duffy  Procedure(s) Performed: Procedure(s) (LRB) with comments: CATARACT EXTRACTION PHACO AND INTRAOCULAR LENS PLACEMENT (IOC) (Right)  Patient Location: Short Stay  Anesthesia Type: MAC  Level of Consciousness: awake, alert  and oriented  Airway and Oxygen Therapy: Patient Spontanous Breathing  Post-op Pain: none  Post-op Assessment: Post-op Vital signs reviewed  Post-op Vital Signs: Reviewed  Complications: No apparent anesthesia complications

## 2012-01-05 NOTE — Op Note (Signed)
Anthony Duffy 01/05/2012 Cataract: Combined, Nuclear  Procedure: Phacoemulsification, Posterior Chamber Intra-ocular Lens Operative Eye:  right eye  Surgeon: Shade Flood Estimated Blood Loss: minimal Specimens for Pathology:  None Complications: none  The patient was prepared and draped in the usual manner for ocular surgery on the right eye. A Cook lid speculum was placed. A peripheral clear corneal incision was made at the surgical limbus centered at the 11:00 meridian. A separate clear corneal stab incision was made with a 15 degree blade at the 2:00 meridian to permit bi-manual technique. Provisc was instilled into the anterior chamber through that incision.  A keratome was used to create a self sealing incision entering the anterior chamber at the 11:00 meridian. A capsulorhexis was performed using a bent 25g needle. The lens was hydrodissected and the nucleus was hydrodilineated using a Nichammin cannula. The Chang chopper was inserted and used to rotate the lens to insure adequate lens mobility. The phacoemulsification handpiece was inserted and a combined phaco-chop technique was employed, fracturing the lens into separate sections with subsequent removal with the phaco handpiece.   The I/A cannula was used to remove remaining lens cortex. Provisc was instilled and used to deepen the anterior chamber and posterior capsule bag. The Monarch injector was used to place a folded Acrysof [id number] PC IOL, + 20.00  diopters, into the capsule bag. A Sinskey lens hook was used to dial in the trailing haptic.  The I/A cannula was used to remove the viscoelastic from the anterior chamber. BSS was used to bring IOP to the desired range and the wound was checked to insure it was watertight. Subconjunctival injections of Ance 100/0.80ml and Dexamethasone 4mg /62ml were placed without complication. The lid speculum and drapes were removed and the patient's eye was patched with Polymixin/Bacitracin ophthalmic  ointment. An eye shield was placed and the patient was transferred alert and conversant from the operating room to the post-operative recovery area.   Shade Flood, MD

## 2012-01-05 NOTE — Interval H&P Note (Signed)
History and Physical Interval Note:  01/05/2012 10:59 AM  Anthony Duffy  has presented today for surgery, with the diagnosis of Combined Cataract Left Eye  The various methods of treatment have been discussed with the patient and family. After consideration of risks, benefits and other options for treatment, the patient has consented to  Procedure(s) (LRB) with comments: CATARACT EXTRACTION PHACO AND INTRAOCULAR LENS PLACEMENT (IOC) (Right) as a surgical intervention .  The patient's history has been reviewed, patient examined, no change in status, stable for surgery.  I have reviewed the patient's chart and labs.  Questions were answered to the patient's satisfaction.     Shade Flood, MD

## 2012-01-05 NOTE — H&P (View-Only) (Signed)
  Pre-operative History and Physical for Ophthalmic Surgery  Anthony Duffy 12/20/2011                  Chief Complaint: Decreased vision Right Eye  Diagnosis: Combioned Cataract Right Eye  No Known Allergies   Prior to Admission medications   Medication Sig Start Date End Date Taking? Authorizing Provider  chlorthalidone (HYGROTON) 25 MG tablet Take 0.5 tablets (12.5 mg total) by mouth daily. 11/22/11 11/21/12  Rhetta Mura, MD    Planned Procedure:                                       Phacoemulsification, Posterior Chamber Intra-ocular Lens Right Eye  There were no vitals filed for this visit.  Pulse: 70         Temp: NE        Resp:  18       ROS: negative apart form blurred vision OD  Past Medical History  Diagnosis Date  . Hypertension     No past surgical history on file.   History   Social History  . Marital Status: Legally Separated    Spouse Name: N/A    Number of Children: N/A  . Years of Education: N/A   Occupational History  . Not on file.   Social History Main Topics  . Smoking status: Never Smoker   . Smokeless tobacco: Not on file  . Alcohol Use: No  . Drug Use: No  . Sexually Active:    Other Topics Concern  . Not on file   Social History Narrative  . No narrative on file     The following examination is for anesthesia clearance for minimally invasive Ophthalmic surgery. It is primarily to document heart and lung findings and is not intended to elucidate unknown general medical conditions inclusive of abdominal masses, lung lesions, etc.   General Constitution:  within normal limits   Alertness/Orientation:  Person, time place     yes   HEENT:  Eye Findings:  Combined Cataract                   right eye  Neck: supple without masses  Chest/Lungs: clear to auscultation  Cardiac: Normal S1 and S2 without Murmur, S3 or S4  Neuro: non-focal  Impression:  Combined Cataract Right Eye  Planned Procedure:  Phacoemulsification,  Posterior Chamber Intraocular Lens Right [name], MD

## 2012-01-05 NOTE — Anesthesia Preprocedure Evaluation (Addendum)
Anesthesia Evaluation  Patient identified by MRN, date of birth, ID band Patient awake    Reviewed: Allergy & Precautions, H&P , NPO status , Patient's Chart, lab work & pertinent test results  History of Anesthesia Complications Negative for: history of anesthetic complications  Airway Mallampati: I TM Distance: >3 FB Neck ROM: Full    Dental  (+) Dental Advisory Given, Missing and Poor Dentition   Pulmonary asthma ,    Pulmonary exam normal       Cardiovascular hypertension, Pt. on medications     Neuro/Psych negative neurological ROS     GI/Hepatic negative GI ROS, Neg liver ROS,   Endo/Other  negative endocrine ROS  Renal/GU negative Renal ROS     Musculoskeletal   Abdominal   Peds  Hematology   Anesthesia Other Findings   Reproductive/Obstetrics                          Anesthesia Physical Anesthesia Plan  ASA: III  Anesthesia Plan: MAC   Post-op Pain Management:    Induction: Intravenous  Airway Management Planned: Simple Face Mask  Additional Equipment:   Intra-op Plan:   Post-operative Plan:   Informed Consent: I have reviewed the patients History and Physical, chart, labs and discussed the procedure including the risks, benefits and alternatives for the proposed anesthesia with the patient or authorized representative who has indicated his/her understanding and acceptance.   Dental advisory given  Plan Discussed with: CRNA, Anesthesiologist and Surgeon  Anesthesia Plan Comments:        Anesthesia Quick Evaluation

## 2012-01-06 ENCOUNTER — Encounter (HOSPITAL_COMMUNITY): Payer: Self-pay | Admitting: Ophthalmology

## 2012-01-06 NOTE — Anesthesia Postprocedure Evaluation (Signed)
Anesthesia Post Note  Patient: Anthony Duffy  Procedure(s) Performed: Procedure(s) (LRB): CATARACT EXTRACTION PHACO AND INTRAOCULAR LENS PLACEMENT (IOC) (Right)  Anesthesia type: MAC  Patient location: PACU  Post pain: Pain level controlled  Post assessment: Patient's Cardiovascular Status Stable  Last Vitals:  Filed Vitals:   01/05/12 1231  BP: 134/86  Pulse: 54  Temp: 36.8 C  Resp: 16    Post vital signs: Reviewed and stable  Level of consciousness: sedated  Complications: No apparent anesthesia complications

## 2012-02-11 ENCOUNTER — Emergency Department (INDEPENDENT_AMBULATORY_CARE_PROVIDER_SITE_OTHER)
Admission: EM | Admit: 2012-02-11 | Discharge: 2012-02-11 | Disposition: A | Discharge status: Home / Self Care | Payer: Medicare Other | Source: Home / Self Care

## 2012-02-11 ENCOUNTER — Encounter (HOSPITAL_COMMUNITY): Payer: Self-pay | Admitting: Emergency Medicine

## 2012-02-11 DIAGNOSIS — J45901 Unspecified asthma with (acute) exacerbation: Secondary | ICD-10-CM

## 2012-02-11 DIAGNOSIS — J069 Acute upper respiratory infection, unspecified: Secondary | ICD-10-CM

## 2012-02-11 MED ORDER — PREDNISONE 20 MG PO TABS: 60.0000 mg | ORAL_TABLET | Freq: Every day | ORAL | Status: DC

## 2012-02-11 MED ORDER — IPRATROPIUM-ALBUTEROL 0.5-2.5 (3) MG/3ML IN SOLN: 3.0000 mL | Freq: Four times a day (QID) | RESPIRATORY_TRACT | Status: DC

## 2012-02-11 MED ORDER — ALBUTEROL SULFATE (5 MG/ML) 0.5% IN NEBU
INHALATION_SOLUTION | RESPIRATORY_TRACT | Status: AC
  Filled 2012-02-11: qty 1

## 2012-02-11 MED ORDER — IPRATROPIUM BROMIDE 0.02 % IN SOLN
0.5000 mg | RESPIRATORY_TRACT | Status: DC
  Administered 2012-02-11: 0.5 mg via RESPIRATORY_TRACT

## 2012-02-11 MED ORDER — AZITHROMYCIN 250 MG PO TABS: ORAL_TABLET | ORAL | Status: DC

## 2012-02-11 MED ORDER — ALBUTEROL SULFATE (5 MG/ML) 0.5% IN NEBU
2.5000 mg | INHALATION_SOLUTION | RESPIRATORY_TRACT | Status: DC
  Administered 2012-02-11: 2.5 mg via RESPIRATORY_TRACT

## 2012-02-11 NOTE — ED Provider Notes (Signed)
CC:  Chest congestion, hard to breath  HPI 71 y.o. male with cough starting one week ago. Increased phlegm - clear. Short of breath. Wheezing. No chest pain. Symptoms worse at night. No cold symptoms - no nasal congestion or drainage. Throat itching. No ear pain/pressure. Low grade fever last night with sweats (subjective). Has tried mucinex, [facility] cough syrup, help some. Has albuterol nebulizer and inhaler and has used both a few times a day - helps temporarily. No sick contacts. Treated with steroids and bronchodilators in September. Pt states he thinks he has been told he might have emphysema. He does not have PCP and has generally been healthy so doesn't see doctor regularly. 30 pack-year hx smoking though has not smoked in 20 years.  CXR in September showed hyperinflation but no acute cardiopulmonary abnl.  Past Medical History  Diagnosis Date  . Hypertension   . Asthma     as child  . Bronchitis   . Arthritis    Past Surgical History  Procedure Date  . Finger surgery     s/p injury 30 years ago  . Cataract extraction w/phaco 01/05/2012    Procedure: CATARACT EXTRACTION PHACO AND INTRAOCULAR LENS PLACEMENT (IOC);  Surgeon: Shade Flood, MD;  Location: [facility] OR;  Service: Ophthalmology;  Laterality: Right;   Smoked more than 20 years ago, no EtOH  Filed Vitals:   02/11/12 1742  BP: 177/88  Pulse: 85  Temp: 98.8 F (37.1 C)  Resp: 20  O2 sat 91% on arrival, 94-95 at time of exam   PHYSICAL EXAM Gen:  Alert and oriented, pleasant older gentleman, looks stated age, thin. HEENT:  PERRL, conjunctiva normal, EOMI, no frontal/maxillary tenderness. External nares normal. OP - mild erythema/cobblestoning, clear mucous. External ears, canals and TMs normal bilaterally Neck:  No LAD CV:  RRR, no murmur Lungs:  Diffuse wheezing, no crackles, good air movement with prolonged expiratory phase. No retractions. No distress. Regular rate.  Neuro: no focal deficits Extrem:  Warm, well  perfused  PROGRESS/TREATMENTS  Neb treatment with albuterol and ipratropium given. Subjectively improved. Still diffuse wheezing but improved. O2 sat still 94-96%.  A/P 71 y.o. male with acute exacerbation of likely chronic asthma or COPD - Albuterol q 4 hours, predisone, azithromycin - Elevated BP - on chlorthalidone. Needs PCP for f/u.  Napoleon Form, MD 02/11/2012 6:59 PM    Napoleon Form, MD 02/11/12 2202

## 2012-02-11 NOTE — ED Notes (Signed)
Reports a "bad cold"  "can't breathe"  Symptoms for more than a week.  Patient sounds stuffy with talking. Has cough

## 2012-02-12 ENCOUNTER — Telehealth (HOSPITAL_COMMUNITY): Payer: Self-pay | Admitting: Family Medicine

## 2012-02-12 MED ORDER — IPRATROPIUM-ALBUTEROL 0.5-2.5 (3) MG/3ML IN SOLN: 3.0000 mL | Freq: Four times a day (QID) | RESPIRATORY_TRACT | Status: DC | PRN

## 2012-02-12 NOTE — ED Notes (Signed)
Pharmacy called stating that there was missing information of the DUONEB RX for Medicare part B to cover.  Original RX canceled, new RX with missing info sent to pharmacy.  Pharmacy called again stating there was still missing info.  She stated that we could change the RX to [employer].  Dr Thad Ranger reviewed chart again and stated that substitution would be okay.

## 2013-05-18 ENCOUNTER — Encounter (HOSPITAL_COMMUNITY): Payer: Self-pay | Admitting: Emergency Medicine

## 2013-05-18 ENCOUNTER — Emergency Department (INDEPENDENT_AMBULATORY_CARE_PROVIDER_SITE_OTHER): Payer: Medicare Other

## 2013-05-18 ENCOUNTER — Emergency Department (INDEPENDENT_AMBULATORY_CARE_PROVIDER_SITE_OTHER)
Admission: EM | Admit: 2013-05-18 | Discharge: 2013-05-18 | Disposition: A | Discharge status: Home / Self Care | Payer: Medicare Other | Source: Home / Self Care | Attending: Family Medicine | Admitting: Family Medicine

## 2013-05-18 DIAGNOSIS — J45901 Unspecified asthma with (acute) exacerbation: Secondary | ICD-10-CM

## 2013-05-18 MED ORDER — IPRATROPIUM-ALBUTEROL 0.5-2.5 (3) MG/3ML IN SOLN
3.0000 mL | RESPIRATORY_TRACT | Status: DC
  Administered 2013-05-18: 3 mL via RESPIRATORY_TRACT

## 2013-05-18 MED ORDER — TRAMADOL HCL 50 MG PO TABS: 50.0000 mg | ORAL_TABLET | Freq: Every evening | ORAL | Status: DC | PRN

## 2013-05-18 MED ORDER — IPRATROPIUM-ALBUTEROL 0.5-2.5 (3) MG/3ML IN SOLN
RESPIRATORY_TRACT | Status: AC
  Filled 2013-05-18: qty 3

## 2013-05-18 MED ORDER — ALBUTEROL SULFATE (2.5 MG/3ML) 0.083% IN NEBU: 2.5000 mg | INHALATION_SOLUTION | Freq: Four times a day (QID) | RESPIRATORY_TRACT | Status: DC | PRN

## 2013-05-18 MED ORDER — PREDNISONE 50 MG PO TABS: 50.0000 mg | ORAL_TABLET | Freq: Every day | ORAL | Status: DC

## 2013-05-18 NOTE — ED Provider Notes (Signed)
Anthony Duffy is a 73 y.o. male who presents to Urgent Care today for cough and congestion. Patient has had a productive cough associated with mild shortness of breath and night sweats for 1.5 weeks. He has tried over-the-counter medications which have not helped much. Does not smoke chest congestion. He denies any significant wheezing. No current fevers or chills. He is well otherwise.   Past Medical History  Diagnosis Date  . Hypertension   . Asthma     as child  . Bronchitis   . Arthritis    History  Substance Use Topics  . Smoking status: Former Research scientist (life sciences)  . Smokeless tobacco: Not on file     Comment: stopped 15 years ago  . Alcohol Use: No     Comment: stopped 15 years ago   ROS as above Medications: Current Facility-Administered Medications  Medication Dose Route Frequency Provider Last Rate Last Dose  . ipratropium-albuterol (DUONEB) 0.5-2.5 (3) MG/3ML nebulizer solution 3 mL  3 mL Nebulization Q4H Anthony Hams, MD   3 mL at 05/18/13 1901   Current Outpatient Prescriptions  Medication Sig Dispense Refill  . albuterol (PROVENTIL HFA;VENTOLIN HFA) 108 (90 BASE) MCG/ACT inhaler Inhale 2 puffs into the lungs every 4 (four) hours as needed for wheezing.  1 Inhaler  1  . albuterol (PROVENTIL) (2.5 MG/3ML) 0.083% nebulizer solution Take 3 mLs (2.5 mg total) by nebulization every 6 (six) hours as needed for wheezing.  75 mL  1  . chlorthalidone (HYGROTON) 25 MG tablet Take 0.5 tablets (12.5 mg total) by mouth daily.  30 tablet  1  . ipratropium-albuterol (DUONEB) 0.5-2.5 (3) MG/3ML SOLN Take 3 mLs by nebulization every 6 (six) hours as needed.  360 mL  0  . methylPREDNISolone (MEDROL DOSEPAK) 4 MG tablet Take as directed  21 tablet  0  . OVER THE COUNTER MEDICATION Cough medicine      . Phenylephrine-Chlorphen-DM 01-07-11.5 MG/5ML LIQD Take 5 mLs by mouth every other day as needed.  120 mL  0  . predniSONE (DELTASONE) 50 MG tablet Take 1 tablet (50 mg total) by mouth daily.  5 tablet  0   . selenium sulfide (SELSUN) 2.5 % shampoo Apply topically 2 (two) times a week.  120 mL  1  . traMADol (ULTRAM) 50 MG tablet Take 1 tablet (50 mg total) by mouth at bedtime as needed (cough).  10 tablet  0    Exam:  BP 153/79  Pulse 75  Temp(Src) 98.7 F (37.1 C) (Oral)  Resp 16  SpO2 96% Gen: Well NAD  HEENT: EOMI,  MMM posterior pharynx is normal-appearing. Tympanic membranes are normal appearing bilaterally. Lungs: Normal work of breathing. Prolonged expiratory phase and wheezing present bilaterally. Rhonchi left lung. Heart: RRR no MRG Abd: NABS, Soft. NT, ND Exts: Brisk capillary refill, warm and well perfused.   Patient was given a DuoNeb nebulizer treatment and had considerable improvement in symptoms  No results found for this or any previous visit (from the past 24 hour(s)). Dg Chest 2 View  05/18/2013   CLINICAL DATA:  Chest congestion.  Cough.  EXAM: CHEST  2 VIEW  COMPARISON:  Chest x-ray 12/29/2011.  FINDINGS: Lung volumes are normal. No consolidative airspace disease. No pleural effusions. No pneumothorax. No pulmonary nodule or mass noted. Pulmonary vasculature and the cardiomediastinal silhouette are within normal limits.  IMPRESSION: 1.  No radiographic evidence of acute cardiopulmonary disease.   Electronically Signed   By: Anthony Duffy M.D.   On:  05/18/2013 18:25    Assessment and Plan: 73 y.o. male with asthma or COPD exacerbation. Possibly related to a viral illness. No evidence of pneumonia based on chest x-ray. Plan to treat with prednisone and albuterol. Additionally we'll use tramadol for cough control. Followup with primary care provider as needed.  Discussed warning signs or symptoms. Please see discharge instructions. Patient expresses understanding.    Anthony Hams, MD 05/18/13 [name]

## 2013-05-18 NOTE — ED Notes (Signed)
C/o  Chest congestion.  Productive cough with white sputum.  Sob.  Headache off/on.  Night sweats.  X 1 1/2 wk.  No relief with otc. Denies n/v/d

## 2013-05-18 NOTE — Discharge Instructions (Signed)
Thank you for coming in today. Use albuterol as needed.  Take prednisone daily for 5 days.  Use tramadol for cough as needed.  Follow up with your doctor.  Call or go to the emergency room if you get worse, have trouble breathing, have chest pains, or palpitations.   Asthma, Acute Bronchospasm Acute bronchospasm caused by asthma is also referred to as an asthma attack. Bronchospasm means your air passages become narrowed. The narrowing is caused by inflammation and tightening of the muscles in the air tubes (bronchi) in your lungs. This can make it hard to breath or cause you to wheeze and cough. CAUSES Possible triggers are:  Animal dander from the skin, hair, or feathers of animals.  Dust mites contained in house dust.  Cockroaches.  Pollen from trees or grass.  Mold.  Cigarette or tobacco smoke.  Air pollutants such as dust, household cleaners, hair sprays, aerosol sprays, paint fumes, strong chemicals, or strong odors.  Cold air or weather changes. Cold air may trigger inflammation. Winds increase molds and pollens in the air.  Strong emotions such as crying or laughing hard.  Stress.  Certain medicines such as aspirin or beta-blockers.  Sulfites in foods and drinks, such as dried fruits and wine.  Infections or inflammatory conditions, such as a flu, cold, or inflammation of the nasal membranes (rhinitis).  Gastroesophageal reflux disease (GERD). GERD is a condition where stomach acid backs up into your throat (esophagus).  Exercise or strenuous activity. SIGNS AND SYMPTOMS   Wheezing.  Excessive coughing, particularly at night.  Chest tightness.  Shortness of breath. DIAGNOSIS  Your health care provider will ask you about your medical history and perform a physical exam. A chest X-ray or blood testing may be performed to look for other causes of your symptoms or other conditions that may have triggered your asthma attack. TREATMENT  Treatment is aimed at  reducing inflammation and opening up the airways in your lungs. Most asthma attacks are treated with inhaled medicines. These include quick relief or rescue medicines (such as bronchodilators) and controller medicines (such as inhaled corticosteroids). These medicines are sometimes given through an inhaler or a nebulizer. Systemic steroid medicine taken by mouth or given through an IV tube also can be used to reduce the inflammation when an attack is moderate or severe. Antibiotic medicines are only used if a bacterial infection is present.  HOME CARE INSTRUCTIONS   Rest.  Drink plenty of liquids. This helps the mucus to remain thin and be easily coughed up. Only use caffeine in moderation and do not use alcohol until you have recovered from your illness.  Do not smoke. Avoid being exposed to secondhand smoke.  You play a critical role in keeping yourself in good health. Avoid exposure to things that cause you to wheeze or to have breathing problems.  Keep your medicines up to date and available. Carefully follow your health care provider's treatment plan.  Take your medicine exactly as prescribed.  When pollen or pollution is bad, keep windows closed and use an air conditioner or go to places with air conditioning.  Asthma requires careful medical care. See your health care provider for a follow-up as advised. If you are more than [redacted] weeks pregnant and you were prescribed any new medicines, let your obstetrician know about the visit and how you are doing. Follow-up with your health care provider as directed.  After you have recovered from your asthma attack, make an appointment with your outpatient doctor to  talk about ways to reduce the likelihood of future attacks. If you do not have a doctor who manages your asthma, make an appointment with a primary care doctor to discuss your asthma. SEEK IMMEDIATE MEDICAL CARE IF:   You are getting worse.  You have trouble breathing. If severe, call  your local emergency services (911 in the [place]  You develop chest pain or discomfort.  You are vomiting.  You are not able to keep fluids down.  You are coughing up yellow, green, brown, or bloody sputum.  You have a fever and your symptoms suddenly get worse.  You have trouble swallowing. MAKE SURE YOU:   Understand these instructions.  Will watch your condition.  Will get help right away if you are not doing well or get worse. Document Released: 07/07/2006 Document Revised: 11/22/2012 Document Reviewed: 09/27/2012 [facility] Patient Information 2014 [place], [place].

## 2013-06-21 ENCOUNTER — Emergency Department (INDEPENDENT_AMBULATORY_CARE_PROVIDER_SITE_OTHER): Payer: Medicare Other

## 2013-06-21 ENCOUNTER — Encounter (HOSPITAL_COMMUNITY): Payer: Self-pay | Admitting: Emergency Medicine

## 2013-06-21 ENCOUNTER — Emergency Department (INDEPENDENT_AMBULATORY_CARE_PROVIDER_SITE_OTHER)
Admission: EM | Admit: 2013-06-21 | Discharge: 2013-06-21 | Disposition: A | Discharge status: Home / Self Care | Payer: Medicare Other | Source: Home / Self Care

## 2013-06-21 DIAGNOSIS — R0609 Other forms of dyspnea: Secondary | ICD-10-CM

## 2013-06-21 DIAGNOSIS — J441 Chronic obstructive pulmonary disease with (acute) exacerbation: Secondary | ICD-10-CM

## 2013-06-21 DIAGNOSIS — R0989 Other specified symptoms and signs involving the circulatory and respiratory systems: Secondary | ICD-10-CM

## 2013-06-21 DIAGNOSIS — R0600 Dyspnea, unspecified: Secondary | ICD-10-CM

## 2013-06-21 MED ORDER — PREDNISONE 5 MG PO KIT: PACK | ORAL | Status: DC

## 2013-06-21 MED ORDER — ALBUTEROL SULFATE (2.5 MG/3ML) 0.083% IN NEBU
2.5000 mg | INHALATION_SOLUTION | Freq: Once | RESPIRATORY_TRACT | Status: AC
  Administered 2013-06-21: 2.5 mg via RESPIRATORY_TRACT

## 2013-06-21 MED ORDER — ALBUTEROL SULFATE HFA 108 (90 BASE) MCG/ACT IN AERS: 2.0000 | INHALATION_SPRAY | RESPIRATORY_TRACT | Status: DC | PRN

## 2013-06-21 MED ORDER — IPRATROPIUM-ALBUTEROL 0.5-2.5 (3) MG/3ML IN SOLN
3.0000 mL | Freq: Once | RESPIRATORY_TRACT | Status: AC
  Administered 2013-06-21: 3 mL via RESPIRATORY_TRACT

## 2013-06-21 MED ORDER — TRIAMCINOLONE ACETONIDE 40 MG/ML IJ SUSP
40.0000 mg | Freq: Once | INTRAMUSCULAR | Status: AC
  Administered 2013-06-21: 40 mg via INTRAMUSCULAR

## 2013-06-21 MED ORDER — TRIAMCINOLONE ACETONIDE 40 MG/ML IJ SUSP
INTRAMUSCULAR | Status: AC
  Filled 2013-06-21: qty 1

## 2013-06-21 MED ORDER — ALBUTEROL SULFATE (2.5 MG/3ML) 0.083% IN NEBU
INHALATION_SOLUTION | RESPIRATORY_TRACT | Status: AC
  Filled 2013-06-21: qty 3

## 2013-06-21 MED ORDER — ALBUTEROL SULFATE (2.5 MG/3ML) 0.083% IN NEBU: 2.5000 mg | INHALATION_SOLUTION | Freq: Four times a day (QID) | RESPIRATORY_TRACT | Status: DC | PRN

## 2013-06-21 MED ORDER — IPRATROPIUM-ALBUTEROL 0.5-2.5 (3) MG/3ML IN SOLN
RESPIRATORY_TRACT | Status: AC
  Filled 2013-06-21: qty 3

## 2013-06-21 NOTE — ED Provider Notes (Signed)
CSN: [id number]     Arrival date & time 06/21/13  1209 History   First MD Initiated Contact with Patient 06/21/13 1318     Chief Complaint  Patient presents with  . Cough   (Consider location/radiation/quality/duration/timing/severity/associated sxs/prior Treatment) HPI Comments: 73 y o M with COPD/asthma with exacerbation of dyspnea, cough, wheeze and nasal congestion, chest congestion and night sweats. Started 3 wks ago. Was seen in urgent care for similar sx's 1 month ago.   Past Medical History  Diagnosis Date  . Hypertension   . Asthma     as child  . Bronchitis   . Arthritis    Past Surgical History  Procedure Laterality Date  . Finger surgery      s/p injury 30 years ago  . Cataract extraction w/phaco  01/05/2012    Procedure: CATARACT EXTRACTION PHACO AND INTRAOCULAR LENS PLACEMENT (IOC);  Surgeon: Adonis Brook, MD;  Location: [place];  Service: Ophthalmology;  Laterality: Right;   History reviewed. No pertinent family history. History  Substance Use Topics  . Smoking status: Former Research scientist (life sciences)  . Smokeless tobacco: Not on file     Comment: stopped 15 years ago  . Alcohol Use: No     Comment: stopped 15 years ago    Review of Systems  Constitutional: Positive for diaphoresis, activity change and fatigue.  HENT: Positive for congestion and rhinorrhea. Negative for ear pain, postnasal drip, sore throat and trouble swallowing.   Respiratory: Positive for cough, shortness of breath and wheezing.   Cardiovascular: Negative for chest pain and leg swelling.  Gastrointestinal: Negative.   Genitourinary: Negative.   Neurological: Negative.     Allergies  Review of patient's allergies indicates no known allergies.  Home Medications   Current Outpatient Rx  Name  Route  Sig  Dispense  Refill  . albuterol (PROVENTIL HFA;VENTOLIN HFA) 108 (90 BASE) MCG/ACT inhaler   Inhalation   Inhale 2 puffs into the lungs every 4 (four) hours as needed for wheezing.   1 Inhaler   1    . albuterol (PROVENTIL HFA;VENTOLIN HFA) 108 (90 BASE) MCG/ACT inhaler   Inhalation   Inhale 2 puffs into the lungs every 4 (four) hours as needed for wheezing or shortness of breath.   1 Inhaler   0   . albuterol (PROVENTIL) (2.5 MG/3ML) 0.083% nebulizer solution   Nebulization   Take 3 mLs (2.5 mg total) by nebulization every 6 (six) hours as needed for wheezing. Asthma exacerbation ICD9 493.92   75 mL   1   . albuterol (PROVENTIL) (2.5 MG/3ML) 0.083% nebulizer solution   Nebulization   Take 3 mLs (2.5 mg total) by nebulization every 6 (six) hours as needed for wheezing or shortness of breath.   25 mL   0   . EXPIRED: chlorthalidone (HYGROTON) 25 MG tablet   Oral   Take 0.5 tablets (12.5 mg total) by mouth daily.   30 tablet   1   . ipratropium-albuterol (DUONEB) 0.5-2.5 (3) MG/3ML SOLN   Nebulization   Take 3 mLs by nebulization every 6 (six) hours as needed.   360 mL   0     Acute asthma exacerbation.  943.02   . methylPREDNISolone (MEDROL DOSEPAK) 4 MG tablet      Take as directed   21 tablet   0   . OVER THE COUNTER MEDICATION      Cough medicine         . Phenylephrine-Chlorphen-DM 01-07-11.5 MG/5ML LIQD  Oral   Take 5 mLs by mouth every other day as needed.   120 mL   0   . predniSONE (DELTASONE) 50 MG tablet   Oral   Take 1 tablet (50 mg total) by mouth daily.   5 tablet   0   . PredniSONE 5 MG KIT      Take as directed  Disp 1 12 day pack.   1 kit   0   . selenium sulfide (SELSUN) 2.5 % shampoo   Topical   Apply topically 2 (two) times a week.   120 mL   1   . traMADol (ULTRAM) 50 MG tablet   Oral   Take 1 tablet (50 mg total) by mouth at bedtime as needed (cough).   10 tablet   0    BP 133/78  Pulse 82  Temp(Src) 98.6 F (37 C) (Oral)  Resp 16  SpO2 96% Physical Exam  Nursing note and vitals reviewed. Constitutional: He is oriented to person, place, and time. He appears well-developed and well-nourished. No distress.   HENT:  Mouth/Throat: No oropharyngeal exudate.  OP mildly injected; scant PND  Eyes: Conjunctivae and EOM are normal.  Neck: Normal range of motion. Neck supple.  Cardiovascular: Normal rate and normal heart sounds.   Pulmonary/Chest: He has no rales.  Increased effort but can speak in complete sentences. Diffuse, bilateralI and O wheezes Prolonged expir phase  Abdominal: Soft. There is no tenderness.  Musculoskeletal: He exhibits no edema and no tenderness.  Lymphadenopathy:    He has no cervical adenopathy.  Neurological: He is alert and oriented to person, place, and time. He exhibits normal muscle tone.  Skin: Skin is warm and dry.  Psychiatric: He has a normal mood and affect.    ED Course  Procedures (including critical care time) Labs Review Labs Reviewed - No data to display Imaging Review Dg Chest 2 View  06/21/2013   CLINICAL DATA:  Shortness of breath and productive cough.  EXAM: CHEST  2 VIEW  COMPARISON:  05/18/2013  FINDINGS: There is chronic hyperinflation and suspect emphysematous changes in the chest. There is no focal airspace disease or pulmonary edema. Normal appearance of the heart and mediastinum. The trachea is midline. Degenerative changes in the upper and mid thoracic spine.  IMPRESSION: No acute cardiopulmonary disease.  Stable hyperinflation.   Electronically Signed   By: Markus Daft M.D.   On: 06/21/2013 14:15     MDM   1. COPD exacerbation   2. Dyspnea      Duoneb: 1400h post Duoneb pt st breathing a little better. Auscultation decrease in wheezes, now end expiratory only, improved air movement.  Kenalog 40 mg IM Repeat Albuterol 20 min after completing the first one. 1440h: S/P second neb further improvement with just a few expiratory wheezes and improved air movement. Pt st ready to go home.  Rx for albuterol sol'n 0.083% for neb and an HFA Sterapred 5 mg dose pack.  F/U with PCP next week.   Janne Napoleon, NP 06/21/13 [phone]

## 2013-06-21 NOTE — Discharge Instructions (Signed)
Chronic Obstructive Pulmonary Disease Exacerbation Chronic obstructive pulmonary disease (COPD) is a common lung condition in which airflow from the lungs is limited. COPD is a general term that can be used to describe many different lung problems that limit airflow, including chronic bronchitis and emphysema. COPD exacerbations are episodes when breathing symptoms become much worse and require extra treatment. Without treatment, COPD exacerbations can be life threatening, and frequent COPD exacerbations can cause further damage to your lungs. CAUSES   Respiratory infections.   Exposure to smoke.   Exposure to air pollution, chemical fumes, or dust. Sometimes there is no apparent cause or trigger. RISK FACTORS  Smoking cigarettes.  Older age.  Frequent prior COPD exacerbations. SIGNS AND SYMPTOMS   Increased coughing.   Increased thick spit (sputum) production.   Increased wheezing.   Increased shortness of breath.   Rapid breathing.   Chest tightness. DIAGNOSIS  Your medical history, a physical exam, and tests will help your health care provider make a diagnosis. Tests may include:  A chest X-ray.  Basic lab tests.  Sputum testing.  An arterial blood gas test. TREATMENT  Depending on the severity of your COPD exacerbation, you may need to be admitted to a hospital for treatment. Some of the treatments commonly used to treat COPD exacerbations are:   Antibiotic medicines.   Bronchodilators. These are drugs that expand the air passages. They may be given with an inhaler or nebulizer. Spacer devices may be needed to help improve drug delivery.  Corticosteroid medicines.  Supplemental oxygen therapy.  HOME CARE INSTRUCTIONS   Do not smoke. Quitting smoking is very important to prevent COPD from getting worse and exacerbations from happening as often.  Avoid exposure to all substances that irritate the airway, especially to tobacco smoke.   If prescribed,  take your antibiotics as directed. Finish them even if you start to feel better.  Only take over-the-counter or prescription medicines as directed by your health care provider.It is important to use correct technique with inhaled medicines.  Drink enough fluids to keep your urine clear or pale yellow (unless you have a medical condition that requires fluid restriction).  Use a cool mist vaporizer. This makes it easier to clear your chest when you cough.   If you have a home nebulizer and oxygen, continue to use them as directed.   Maintain all necessary vaccinations to prevent infections.   Exercise regularly.   Eat a healthy diet.   Keep all follow-up appointments as directed by your health care provider. SEEK IMMEDIATE MEDICAL CARE IF:  You have worsening shortness of breath.   You have trouble talking.   You have severe chest pain.  You have blood in your sputum.  You have a fever.  You have weakness, vomit repeatedly, or faint.   You feel confused.   You continue to get worse. MAKE SURE YOU:   Understand these instructions.  Will watch your condition.  Will get help right away if you are not doing well or get worse. Document Released: 01/17/2007 Document Revised: 01/10/2013 Document Reviewed: 11/24/2012 [facility] Patient Information 2014 [place], [place].  How to Use an Inhaler Proper inhaler technique is very important. Good technique ensures that the medicine reaches the lungs. Poor technique results in depositing the medicine on the tongue and back of the throat rather than in the airways. If you do not use the inhaler with good technique, the medicine will not help you. STEPS TO FOLLOW IF USING AN INHALER WITHOUT AN EXTENSION  TUBE 1. Remove the cap from the inhaler. 2. If you are using the inhaler for the first time, you will need to prime it. Shake the inhaler for 5 seconds and release four puffs into the air, away from your face. Ask your health care  provider or pharmacist if you have questions about priming your inhaler. 3. Shake the inhaler for 5 seconds before each breath in (inhalation). 4. Position the inhaler so that the top of the canister faces up. 5. Put your index finger on the top of the medicine canister. Your thumb supports the bottom of the inhaler. 6. Open your mouth. 7. Either place the inhaler between your teeth and place your lips tightly around the mouthpiece, or hold the inhaler 1 2 inches away from your open mouth. If you are unsure of which technique to use, ask your health care provider. 8. Breathe out (exhale) normally and as completely as possible. 9. Press the canister down with your index finger to release the medicine. 10. At the same time as the canister is pressed, inhale deeply and slowly until your lungs are completely filled. This should take 4 6 seconds. Keep your tongue down. 11. Hold the medicine in your lungs for 5 10 seconds (10 seconds is best). This helps the medicine get into the small airways of your lungs. 12. Breathe out slowly, through pursed lips. Whistling is an example of pursed lips. 13. Wait at least 15 30 seconds between puffs. Continue with the above steps until you have taken the number of puffs your health care provider has ordered. Do not use the inhaler more than your health care provider tells you. 14. Replace the cap on the inhaler. 15. Follow the directions from your health care provider or the inhaler insert for cleaning the inhaler. STEPS TO FOLLOW IF USING AN INHALER WITH AN EXTENSION (SPACER) 1. Remove the cap from the inhaler. 2. If you are using the inhaler for the first time, you will need to prime it. Shake the inhaler for 5 seconds and release four puffs into the air, away from your face. Ask your health care provider or pharmacist if you have questions about priming your inhaler. 3. Shake the inhaler for 5 seconds before each breath in (inhalation). 4. Place the open end of  the spacer onto the mouthpiece of the inhaler. 5. Position the inhaler so that the top of the canister faces up and the spacer mouthpiece faces you. 6. Put your index finger on the top of the medicine canister. Your thumb supports the bottom of the inhaler and the spacer. 7. Breathe out (exhale) normally and as completely as possible. 8. Immediately after exhaling, place the spacer between your teeth and into your mouth. Close your lips tightly around the spacer. 9. Press the canister down with your index finger to release the medicine. 10. At the same time as the canister is pressed, inhale deeply and slowly until your lungs are completely filled. This should take 4 6 seconds. Keep your tongue down and out of the way. 11. Hold the medicine in your lungs for 5 10 seconds (10 seconds is best). This helps the medicine get into the small airways of your lungs. Exhale. 12. Repeat inhaling deeply through the spacer mouthpiece. Again hold that breath for up to 10 seconds (10 seconds is best). Exhale slowly. If it is difficult to take this second deep breath through the spacer, breathe normally several times through the spacer. Remove the spacer from your mouth.  13. Wait at least 15 30 seconds between puffs. Continue with the above steps until you have taken the number of puffs your health care provider has ordered. Do not use the inhaler more than your health care provider tells you. 14. Remove the spacer from the inhaler, and place the cap on the inhaler. 15. Follow the directions from your health care provider or the inhaler insert for cleaning the inhaler and spacer. If you are using different kinds of inhalers, use your quick relief medicine to open the airways 10 15 minutes before using a steroid if instructed to do so by your health care provider. If you are unsure which inhalers to use and the order of using them, ask your health care provider, nurse, or respiratory therapist. If you are using a  steroid inhaler, always rinse your mouth with water after your last puff, then gargle and spit out the water. Do not swallow the water. AVOID:  Inhaling before or after starting the spray of medicine. It takes practice to coordinate your breathing with triggering the spray.  Inhaling through the nose (rather than the mouth) when triggering the spray. HOW TO DETERMINE IF YOUR INHALER IS FULL OR NEARLY EMPTY You cannot know when an inhaler is empty by shaking it. A few inhalers are now being made with dose counters. Ask your health care provider for a prescription that has a dose counter if you feel you need that extra help. If your inhaler does not have a counter, ask your health care provider to help you determine the date you need to refill your inhaler. Write the refill date on a calendar or your inhaler canister. Refill your inhaler 7 10 days before it runs out. Be sure to keep an adequate supply of medicine. This includes making sure it is not expired, and that you have a spare inhaler.  SEEK MEDICAL CARE IF:   Your symptoms are only partially relieved with your inhaler.  You are having trouble using your inhaler.  You have some increase in phlegm. SEEK IMMEDIATE MEDICAL CARE IF:   You feel little or no relief with your inhalers. You are still wheezing and are feeling shortness of breath or tightness in your chest or both.  You have dizziness, headaches, or a fast heart rate.  You have chills, fever, or night sweats.  You have a noticeable increase in phlegm production, or there is blood in the phlegm. MAKE SURE YOU:   Understand these instructions.  Will watch your condition.  Will get help right away if you are not doing well or get worse. Document Released: 03/19/2000 Document Revised: 01/10/2013 Document Reviewed: 10/19/2012 [facility] Patient Information 2014 [place], [place].

## 2013-06-21 NOTE — ED Provider Notes (Signed)
Medical screening examination/treatment/procedure(s) were performed by non-physician practitioner and as supervising physician I was immediately available for consultation/collaboration.  Philipp Deputy, M.D.  Harden Mo, MD 06/21/13 [phone]

## 2013-06-21 NOTE — ED Notes (Signed)
Went to get patient for CXR, patient was receiving breathing treatment 

## 2013-06-21 NOTE — ED Notes (Signed)
Pt  Reports  Symptoms  Of  Cough  Congested     Tightness  In  Chest         Congested      And  Wheezing  fopr  3  Weeks   History  Of  Asthma  Ran   Out of his  Albuterol

## 2014-10-14 DIAGNOSIS — C61 Malignant neoplasm of prostate: Secondary | ICD-10-CM

## 2014-10-14 HISTORY — DX: Malignant neoplasm of prostate: C61

## 2014-10-14 HISTORY — PX: PROSTATE BIOPSY: SHX241

## 2014-10-22 ENCOUNTER — Other Ambulatory Visit (HOSPITAL_COMMUNITY): Payer: Self-pay | Admitting: Urology

## 2014-10-22 DIAGNOSIS — C61 Malignant neoplasm of prostate: Secondary | ICD-10-CM

## 2014-10-30 ENCOUNTER — Encounter (HOSPITAL_COMMUNITY)
Admission: RE | Admit: 2014-10-30 | Discharge: 2014-10-30 | Disposition: A | Discharge status: Home / Self Care | Payer: Medicare Other | Source: Ambulatory Visit | Attending: Urology | Admitting: Urology

## 2014-10-30 DIAGNOSIS — C61 Malignant neoplasm of prostate: Secondary | ICD-10-CM | POA: Insufficient documentation

## 2014-10-30 MED ORDER — TECHNETIUM TC 99M MEDRONATE IV KIT
25.0000 | PACK | Freq: Once | INTRAVENOUS | Status: AC | PRN
  Administered 2014-10-30: 25 via INTRAVENOUS

## 2014-11-04 ENCOUNTER — Ambulatory Visit (HOSPITAL_COMMUNITY)
Admission: RE | Admit: 2014-11-04 | Discharge: 2014-11-04 | Disposition: A | Discharge status: Home / Self Care | Payer: Medicare Other | Source: Ambulatory Visit | Attending: Urology | Admitting: Urology

## 2014-11-04 ENCOUNTER — Other Ambulatory Visit (HOSPITAL_COMMUNITY): Payer: Self-pay | Admitting: Urology

## 2014-11-04 DIAGNOSIS — Z87891 Personal history of nicotine dependence: Secondary | ICD-10-CM | POA: Insufficient documentation

## 2014-11-04 DIAGNOSIS — M419 Scoliosis, unspecified: Secondary | ICD-10-CM | POA: Insufficient documentation

## 2014-11-04 DIAGNOSIS — J449 Chronic obstructive pulmonary disease, unspecified: Secondary | ICD-10-CM | POA: Insufficient documentation

## 2014-11-04 DIAGNOSIS — M5134 Other intervertebral disc degeneration, thoracic region: Secondary | ICD-10-CM | POA: Insufficient documentation

## 2014-11-04 DIAGNOSIS — J45909 Unspecified asthma, uncomplicated: Secondary | ICD-10-CM | POA: Insufficient documentation

## 2014-11-04 DIAGNOSIS — M25551 Pain in right hip: Secondary | ICD-10-CM | POA: Diagnosis not present

## 2014-11-04 DIAGNOSIS — C61 Malignant neoplasm of prostate: Secondary | ICD-10-CM

## 2014-11-04 DIAGNOSIS — M549 Dorsalgia, unspecified: Secondary | ICD-10-CM | POA: Insufficient documentation

## 2014-11-11 ENCOUNTER — Other Ambulatory Visit (HOSPITAL_COMMUNITY): Payer: Self-pay | Admitting: Urology

## 2014-11-11 DIAGNOSIS — C61 Malignant neoplasm of prostate: Secondary | ICD-10-CM

## 2014-11-15 ENCOUNTER — Ambulatory Visit (HOSPITAL_COMMUNITY)
Admission: RE | Admit: 2014-11-15 | Discharge: 2014-11-15 | Disposition: A | Discharge status: Home / Self Care | Payer: Medicare Other | Source: Ambulatory Visit | Attending: Urology | Admitting: Urology

## 2014-11-15 DIAGNOSIS — M5126 Other intervertebral disc displacement, lumbar region: Secondary | ICD-10-CM | POA: Insufficient documentation

## 2014-11-15 DIAGNOSIS — C61 Malignant neoplasm of prostate: Secondary | ICD-10-CM

## 2014-11-15 DIAGNOSIS — M4806 Spinal stenosis, lumbar region: Secondary | ICD-10-CM | POA: Diagnosis not present

## 2014-11-15 DIAGNOSIS — M5136 Other intervertebral disc degeneration, lumbar region: Secondary | ICD-10-CM | POA: Insufficient documentation

## 2014-11-15 DIAGNOSIS — M4807 Spinal stenosis, lumbosacral region: Secondary | ICD-10-CM | POA: Diagnosis not present

## 2014-11-15 DIAGNOSIS — M545 Low back pain: Secondary | ICD-10-CM | POA: Diagnosis present

## 2014-11-15 MED ORDER — GADOBENATE DIMEGLUMINE 529 MG/ML IV SOLN
15.0000 mL | Freq: Once | INTRAVENOUS | Status: AC | PRN
  Administered 2014-11-15: 14 mL via INTRAVENOUS

## 2014-11-28 ENCOUNTER — Encounter: Payer: Self-pay | Admitting: Radiation Oncology

## 2014-11-28 NOTE — Progress Notes (Addendum)
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  MRI of lumbar spine howed no metastatic disease. Degenerative changes noted in the  In the lumbar spine and right hip  Past/Anticipated interventions by urology, if JOA:CZYSAY of the Prostate 10/14/14  Past/Anticipated interventions by medical oncology, if any: Unknown  Weight changes, if any: stable   Bowel/Bladder complaints, if any: mild slow stream, no dysuria ,no hematuria ,gets up at night 1-2x to void, regular bowel movements  Nausea/Vomiting, if any:   Pain issues, if any:  NO  SAFETY ISSUES:  Prior radiation? No  Pacemaker/ICD? No  Possible current pregnancy? N/A  Is the patient on methotrexate? NO  Current Complaints / other details: Separated, 6 children, Mother had unknown cancer, paternal cousin male unknown cancer BP 166/89 mmHg  Pulse 68  Temp(Src) 97.8 F (36.6 C) (Oral)  Resp 20  Wt 162 lb 9.6 oz (73.755 kg)  Wt Readings from Last 3 Encounters:  12/03/14 162 lb 9.6 oz (73.755 kg)  11/15/14 160 lb (72.576 kg)  12/29/11 166 lb 9.6 oz (75.569 kg)

## 2014-12-03 ENCOUNTER — Ambulatory Visit
Admission: RE | Admit: 2014-12-03 | Discharge: 2014-12-03 | Disposition: A | Discharge status: Home / Self Care | Payer: Medicare Other | Source: Ambulatory Visit | Attending: Radiation Oncology | Admitting: Radiation Oncology

## 2014-12-03 ENCOUNTER — Encounter: Payer: Self-pay | Admitting: Radiation Oncology

## 2014-12-03 VITALS — BP 166/89 | HR 68 | Temp 97.8°F | Resp 20 | Wt 162.6 lb

## 2014-12-03 DIAGNOSIS — Z87891 Personal history of nicotine dependence: Secondary | ICD-10-CM | POA: Insufficient documentation

## 2014-12-03 DIAGNOSIS — N529 Male erectile dysfunction, unspecified: Secondary | ICD-10-CM | POA: Diagnosis not present

## 2014-12-03 DIAGNOSIS — I10 Essential (primary) hypertension: Secondary | ICD-10-CM | POA: Diagnosis not present

## 2014-12-03 DIAGNOSIS — Z79899 Other long term (current) drug therapy: Secondary | ICD-10-CM | POA: Diagnosis not present

## 2014-12-03 DIAGNOSIS — J45909 Unspecified asthma, uncomplicated: Secondary | ICD-10-CM | POA: Diagnosis not present

## 2014-12-03 DIAGNOSIS — C61 Malignant neoplasm of prostate: Secondary | ICD-10-CM | POA: Insufficient documentation

## 2014-12-03 HISTORY — DX: Malignant neoplasm of prostate: C61

## 2014-12-03 NOTE — Progress Notes (Signed)
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  On 07/17/2014, he was found to have an elevated PSA of 38.44.  He had previously been seen by Dr. Janice Norrie back in 2004 when his PSA was 16.25.  He was advised to have a biopsy that time but he was lost to follow-up.  Dr. Janice Norrie performed ultrasound-guided biopsies on 10/14/2014.  He was found have Gleason 9 (4+5) involving 95% of one core from the left lateral mid gland, Gleason 7 (4+3) involving 95% of one core from the left lateral base, 100% of one core from the left base, and 100% of one core from the left mid gland, and Gleason 7 (3+4) involving 80% of one core from the left lateral apex and 100% of one core from left apex.  He Gleason 6 (3+3) involving 10% of one core from the right lateral base.  I do not have his gland volume at the time this dictation.  He is doing well from a GU and GI standpoint.  His I PSS score today is 4.  He does have erectile dysfunction but can still have suitable erections.  His staging workup included CT scans of the abdomen/pelvis and also a bone scan which were without definite evidence for metastatic disease.  PREVIOUS RADIATION THERAPY: No   PAST MEDICAL HISTORY:  has a past medical history of Hypertension; Asthma; Bronchitis; Arthritis; and Prostate cancer (10/14/2014).     PAST SURGICAL HISTORY:  Past Surgical History  Procedure Laterality Date  . Finger surgery      s/p injury 30 years ago  . Cataract extraction w/phaco  01/05/2012    Procedure: CATARACT  EXTRACTION PHACO AND INTRAOCULAR LENS PLACEMENT (IOC);  Surgeon: Adonis Brook, MD;  Location: [facility];  Service: Ophthalmology;  Laterality: Right;  . Prostate biopsy  10/14/2014  . Cardiac surgery       FAMILY HISTORY: family history includes Cancer in his cousin and mother.  His mother died at 62 of some type of cancer.  His father died in his 1s from "anemia".  No known family history of prostate cancer.   SOCIAL HISTORY:  reports that he quit smoking about 20 years ago. He has never used smokeless tobacco. He reports that he does not drink alcohol or use illicit drugs.  Separated, 6 children.  Retired.  He previously worked as a Agricultural consultant, Geographical information systems officer, and also Dealer.   ALLERGIES: Review of patient's allergies indicates no known allergies.   MEDICATIONS:  Current Outpatient Prescriptions  Medication Sig Dispense Refill  . albuterol (PROVENTIL HFA;VENTOLIN HFA) 108 (90 BASE) MCG/ACT inhaler Inhale 2 puffs into the lungs every 4 (four) hours as needed for wheezing. 1 Inhaler 1  . albuterol (PROVENTIL) (2.5 MG/3ML) 0.083% nebulizer solution Take 3 mLs (2.5 mg total) by nebulization every 6 (six) hours as needed for wheezing or shortness of breath. 25 mL 0  . amLODipine (NORVASC) 10 MG tablet Take 10 mg by mouth daily.    Marland Kitchen ipratropium-albuterol (DUONEB)  0.5-2.5 (3) MG/3ML SOLN Take 3 mLs by nebulization every 6 (six) hours as needed. 360 mL 0  . lisinopril-hydrochlorothiazide (PRINZIDE,ZESTORETIC) 10-12.5 MG per tablet Take 1 tablet by mouth daily.    . meloxicam (MOBIC) 15 MG tablet Take 15 mg by mouth daily.    . traMADol (ULTRAM) 50 MG tablet Take 1 tablet (50 mg total) by mouth at bedtime as needed (cough). 10 tablet 0  . chlorthalidone (HYGROTON) 25 MG tablet Take 0.5 tablets (12.5 mg total) by mouth daily. 30 tablet 1  . methylPREDNISolone (MEDROL DOSEPAK) 4 MG tablet Take as directed (Patient not taking: Reported on 12/03/2014) 21 tablet 0  . naproxen (NAPROSYN) 500 MG tablet Take 500 mg  by mouth 2 (two) times daily with a meal.    . OVER THE COUNTER MEDICATION Cough medicine    . Phenylephrine-Chlorphen-DM 01-07-11.5 MG/5ML LIQD Take 5 mLs by mouth every other day as needed. (Patient not taking: Reported on 12/03/2014) 120 mL 0  . predniSONE (DELTASONE) 50 MG tablet Take 1 tablet (50 mg total) by mouth daily. (Patient not taking: Reported on 12/03/2014) 5 tablet 0  . PredniSONE 5 MG KIT Take as directed  Disp 1 12 day pack. (Patient not taking: Reported on 12/03/2014) 1 kit 0  . selenium sulfide (SELSUN) 2.5 % shampoo Apply topically 2 (two) times a week. (Patient not taking: Reported on 12/03/2014) 120 mL 1   No current facility-administered medications for this encounter.     REVIEW OF SYSTEMS:  Pertinent items are noted in HPI.    PHYSICAL EXAM:  weight is 162 lb 9.6 oz (73.755 kg). His oral temperature is 97.8 F (36.6 C). His blood pressure is 166/89 and his pulse is 68. His respiration is 20.   Rectal examination: The gland is minimally enlarged and there is a palpable nodule along the left mid gland  with questionable distortion of the left lateral sulcus.  No palpable involvement of the seminal vesicles.   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  I explained to Mr. Gassett that his prognosis is related to his stage, PSA level, and Gleason score.  His stage is of intermediate favorability while his PSA level and Gleason score are distinctly unfavorable.  He has high-risk disease.  We discussed various treatment options including 5 weeks of radiation therapy followed by seed  implantation or 8 weeks of external beam/IMRT along with androgen deprivation therapy.  He is leaning toward 8 weeks of external beam/IMRT.  We discussed the potential acute and late toxicities of radiation therapy and also side effects from androgen deprivation therapy.  We talked about the need for placement of 3 gold seed markers for image guidance, we also discussed bladder filling to minimize urinary toxicity.  He understands that his likelihood for cure is probably less than 50%.  He is in agreement with androgen deprivation therapy and placement of 3 gold seeds.  We will make a final decision regarding 8 weeks of external beam/IMRT or 5 weeks of external beam followed by  a seed implant when he returns here for a follow-up visit in 2 months.   PLAN: As discussed above.  I will contact Dr. Sammie Bench office for initiation of androgen deprivation therapy, and we will also ask for placement of 3 gold seed markers for image guidance.  I will see Mr. Lenzo for a follow-up visit in 2 months. I spent 45  minutes face to face with the patient and more than 50% of that time was spent in counseling and/or coordination of care.

## 2014-12-03 NOTE — Progress Notes (Signed)
Please see the Nurse Progress Note in the MD Initial Consult Encounter for this patient. 

## 2014-12-04 ENCOUNTER — Telehealth: Payer: Self-pay | Admitting: *Deleted

## 2014-12-04 NOTE — Telephone Encounter (Signed)
Called patient to inform of lupron inj. On 12-11-14 @ 9:30 am and his gold seed placement on 12-26-14 @ 1:30 pm @ Dr. Janice Norrie' Office and his return visit with Dr. Valere Dross on 01-30-15 - 8:30 am for nurse and his [facility] @ 9 am with Dr. Valere Dross, spoke with patient and he is aware of these appts.

## 2014-12-05 ENCOUNTER — Encounter: Payer: Self-pay | Admitting: Radiation Oncology

## 2014-12-05 NOTE — Progress Notes (Signed)
Chart note: His prostate volume at the time of his biopsy on 10/14/2014 was 32.6 mL.

## 2014-12-13 ENCOUNTER — Encounter: Payer: Self-pay | Admitting: *Deleted

## 2014-12-13 NOTE — Progress Notes (Signed)
[place] Psychosocial Distress Screening Clinical Social Work  Clinical Social Work was referred by distress screening protocol.  The patient scored a 9 on the Psychosocial Distress Thermometer which indicates severe distress. Clinical Social Worker phoned pt as next appt is not until 01/27/15 to assess for distress and other psychosocial needs. Pt appears to have a good understanding of his medical situation and reports his depression and anxiety is related to his new diagnosis. CSW reviewed common emotions experienced with new diagnosis. CSW also discussed supports at [facility] to assist and pt will consider Prostate Support Group for additional support. Pt provided with contact info as well and he agrees to reach out as needed.    ONCBCN DISTRESS SCREENING 12/03/2014  Screening Type Initial Screening  Distress experienced in past week (1-10) 9  Emotional problem type Depression;Nervousness/Anxiety  Spiritual/Religous concerns type Relating to God  Physical Problem type Breathing  Physician notified of physical symptoms Yes  Referral to clinical social work Yes    Clinical Social Worker follow up needed: Yes.    If yes, follow up plan: See above Loren Racer, [place] Worker [place]  [facility] Phone: [phone] Fax: [phone]

## 2015-01-30 ENCOUNTER — Ambulatory Visit: Payer: Medicare Other

## 2015-01-30 ENCOUNTER — Encounter: Payer: Self-pay | Admitting: Radiation Oncology

## 2015-01-30 ENCOUNTER — Ambulatory Visit
Admission: RE | Admit: 2015-01-30 | Discharge: 2015-01-30 | Disposition: A | Discharge status: Home / Self Care | Payer: Medicare Other | Source: Ambulatory Visit | Attending: Radiation Oncology | Admitting: Radiation Oncology

## 2015-01-30 VITALS — BP 130/60 | HR 69 | Temp 97.9°F | Ht 72.0 in | Wt 164.7 lb

## 2015-01-30 DIAGNOSIS — N529 Male erectile dysfunction, unspecified: Secondary | ICD-10-CM | POA: Diagnosis not present

## 2015-01-30 DIAGNOSIS — Z79899 Other long term (current) drug therapy: Secondary | ICD-10-CM | POA: Diagnosis not present

## 2015-01-30 DIAGNOSIS — C61 Malignant neoplasm of prostate: Secondary | ICD-10-CM | POA: Diagnosis present

## 2015-01-30 DIAGNOSIS — Z87891 Personal history of nicotine dependence: Secondary | ICD-10-CM | POA: Diagnosis not present

## 2015-01-30 DIAGNOSIS — J45909 Unspecified asthma, uncomplicated: Secondary | ICD-10-CM | POA: Diagnosis not present

## 2015-01-30 DIAGNOSIS — I10 Essential (primary) hypertension: Secondary | ICD-10-CM | POA: Diagnosis not present

## 2015-01-30 NOTE — Progress Notes (Addendum)
CC: Dr. 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  I first saw the patient in consultation on 12/03/2014.  On 07/17/2014, he was found to have an elevated PSA of 38.44. He had previously been seen by Anthony Duffy back in 2004 when his PSA was 16.25. He was advised to have a biopsy that time but he was lost to follow-up. Anthony Duffy performed ultrasound-guided biopsies on 10/14/2014. He was found have Gleason 9 (4+5) involving 95% of one core from the left lateral mid gland, Gleason 7 (4+3) involving 95% of one core from the left lateral base, 100% of one core from the left base, and 100% of one core from the left mid gland, and Gleason 7 (3+4) involving 80% of one core from the left lateral apex and 100% of one core from left apex. He Gleason 6 (3+3) involving 10% of one core from the right lateral base. I do not have his gland volume at the time this dictation. He is doing well from a GU and GI standpoint.His I PSS score was 4 when saw him in consultation 2 months ago.  This I PSS scoring will be repeated today.  He does have a history of erectile dysfunction.  His staging workup included CT scans of the abdomen/pelvis and also a bone scan which were without evidence for metastatic disease.  He was started on androgen deprivation therapy some time in September and he had placement of 3 gold seed markers by Anthony Duffy on September 22.  He does report hot flashes but these are tolerable.  Physical examination: Alert and oriented. Filed Vitals:   01/30/15 0834  BP: 130/60  Pulse: 69  Temp: 97.9 F (36.6 C)   Rectal examination: The prostate gland is normal in size and there is no palpable nodularity or induration.  Impression: Clinical stage T2c high risk adenocarcinoma prostate.  We again  discussed management options, and also the potential acute and late toxicities of external beam/IMRT.  I need to find out the date of his start of androgen deprivation therapy so I know when to have him return for CT simulation.  We also discussed bladder filling to minimize urinary toxicity.  He will obviously have treatment of his prostate/SV and pelvic lymph nodes.  Ideally, he should receive 2 years of androgen deprivation therapy.  He is unclear as to who his follow-up urologist will be with Anthony Duffy retirement.  Consent is signed today.  He was given literature for review.  Plan: He will return here for CT simulation in early December or late November depending on when he started androgen deprivation therapy.  Anthony Duffy will assume his radiation therapy management in January 2017.  30 minutes was spent face-to-face the patient, primarily counseling patient and coordinating his care.  Addendum: Anthony Duffy was started on androgen deprivation therapy on 12/11/2014.  We will have him return here for CT simulation in late November or early December.

## 2015-01-30 NOTE — Progress Notes (Signed)
Mr. Garrelts reports a weak urinary stream, no dysuria, no straining to void and has good control.  Nocturia x 3.  He has had a Lupron injection, but is not sure of the date.  Will call urology.

## 2015-01-30 NOTE — Addendum Note (Signed)
Encounter addended by: Arloa Koh, MD on: 01/30/2015  3:22 PM<BR>     Documentation filed: Notes Section

## 2015-01-31 NOTE — Addendum Note (Signed)
Encounter addended by: Benn Moulder, RN on: 01/31/2015 10:58 AM<BR>     Documentation filed: Charges VN

## 2015-02-19 DIAGNOSIS — G35 Multiple sclerosis: Secondary | ICD-10-CM | POA: Insufficient documentation

## 2015-03-10 ENCOUNTER — Ambulatory Visit
Admission: RE | Admit: 2015-03-10 | Discharge: 2015-03-10 | Disposition: A | Discharge status: Home / Self Care | Payer: Medicare Other | Source: Ambulatory Visit | Attending: Radiation Oncology | Admitting: Radiation Oncology

## 2015-03-10 DIAGNOSIS — C61 Malignant neoplasm of prostate: Secondary | ICD-10-CM

## 2015-03-10 DIAGNOSIS — Z510 Encounter for antineoplastic radiation therapy: Secondary | ICD-10-CM | POA: Diagnosis not present

## 2015-03-10 NOTE — Progress Notes (Signed)
Complex simulation/treatment planning note: The patient was taken to the CT simulator.  A Vac lock immobilization device was constructed.  A red rubber tube was placed within the rectal vault.  He was catheterized and contrast instilled into the bladder/urethra.  He was then scanned.  I chose an isocenter along the prostate.  The CT data set was sent to the  MIM in system where I contoured his prostate, seminal vesicles, bladder, lower rectosigmoid colon, rectum, and the pelvic lymph node PTV ([id number]).  I'm prescribing 7800 cGy in 40 sessions to his prostate PTV which represents the prostate +0.8 cm except for 0.5 cm along the rectum.  I prescribing 5600 cGy in 40 sessions to his seminal vesicles which are presents his seminal vesicles +0.5 cm.  I'm also prescribing 5600 cGy in 40 sessions to his pelvic lymph node PTV (PTVLN 56).  He'll be treated with VMAT with 6 MV photons.  He is now ready for IMRT simulation/treatment planning.

## 2015-03-12 ENCOUNTER — Encounter: Payer: Self-pay | Admitting: Radiation Oncology

## 2015-03-12 DIAGNOSIS — Z510 Encounter for antineoplastic radiation therapy: Secondary | ICD-10-CM | POA: Diagnosis not present

## 2015-03-12 NOTE — Progress Notes (Signed)
IMRT simulation/treatment planning note: The patient completed IMRT simulation/treatment planning in the management of his high-risk carcinoma the prostate.  IMRT was chosen to decrease the risk for both acute and late bladder and rectal toxicity compared to conventional or 3-D conformal radiation therapy.  Dose volume histograms were obtained for the target structures including his prostate, seminal vesicles, and pelvic lymph nodes in addition to his bladder, rectum, and femoral heads.  We met our departmental guidelines.  I'm prescribing 7800 cGy to his prostate PTV in 40 sessions and 5600 cGy in 40 sessions to his seminal vesicle PTV and pelvic lymph node PTV.  He is being treated with dual ARC VMAT IMRT with 6 MV photons.

## 2015-03-13 DIAGNOSIS — Z510 Encounter for antineoplastic radiation therapy: Secondary | ICD-10-CM | POA: Diagnosis not present

## 2015-03-19 ENCOUNTER — Encounter: Payer: Self-pay | Admitting: Medical Oncology

## 2015-03-19 ENCOUNTER — Ambulatory Visit
Admission: RE | Admit: 2015-03-19 | Discharge: 2015-03-19 | Disposition: A | Discharge status: Home / Self Care | Payer: Medicare Other | Source: Ambulatory Visit | Attending: Radiation Oncology | Admitting: Radiation Oncology

## 2015-03-19 DIAGNOSIS — C61 Malignant neoplasm of prostate: Secondary | ICD-10-CM

## 2015-03-19 DIAGNOSIS — Z510 Encounter for antineoplastic radiation therapy: Secondary | ICD-10-CM | POA: Diagnosis not present

## 2015-03-19 NOTE — Progress Notes (Signed)
.   Oncology Nurse Navigator Documentation  Oncology Nurse Navigator Flowsheets 03/19/2015  Navigator Encounter Type Treatment- I met Anthony Duffy today for the first time. I introduced myself to him and his family and explained my role as the navigator. I gave him my business card and information about our Prostate Support group. He states he is interested in the support group.. I informed him that we will not meet in December but we will resume in January. I asked him to call me with any questions or concerns and I will continue to follow  Him. He voiced understanding.  Treatment Phase First Radiation Tx  Barriers/Navigation Needs No barriers at this time  Support Groups/Services Prostate;Friends and Family  Time Spent with Patient 15

## 2015-03-19 NOTE — Progress Notes (Signed)
Chart note: The patient began his IMRT treatment with dual ARC VMAT IMRT today.  He is being treated with 2 sets of dynamic MLCs corresponding to one set of IMRT treatment devices [phone])

## 2015-03-20 ENCOUNTER — Ambulatory Visit
Admission: RE | Admit: 2015-03-20 | Discharge: 2015-03-20 | Disposition: A | Discharge status: Home / Self Care | Payer: Medicare Other | Source: Ambulatory Visit | Attending: Radiation Oncology | Admitting: Radiation Oncology

## 2015-03-20 DIAGNOSIS — Z510 Encounter for antineoplastic radiation therapy: Secondary | ICD-10-CM | POA: Diagnosis not present

## 2015-03-21 ENCOUNTER — Ambulatory Visit
Admission: RE | Admit: 2015-03-21 | Discharge: 2015-03-21 | Disposition: A | Discharge status: Home / Self Care | Payer: Medicare Other | Source: Ambulatory Visit | Attending: Radiation Oncology | Admitting: Radiation Oncology

## 2015-03-21 ENCOUNTER — Telehealth: Payer: Self-pay | Admitting: *Deleted

## 2015-03-21 DIAGNOSIS — Z510 Encounter for antineoplastic radiation therapy: Secondary | ICD-10-CM | POA: Diagnosis not present

## 2015-03-21 NOTE — Telephone Encounter (Addendum)
Mr. Anthony Duffy called to report that he did not know that his daughter called with concern about him having back pain.  He thinks that he "just strained his back and he feels this is not because of treatment".  He was advised to apply ice followed by heat and he stated he would do this.  He also revealed that he has taken Advil for pain in the past without any negative side-effects and he "may" take if needed. Also advised that when he is getting off the treatment table to turn on his side, push up with his hand closes to the tx table, push up and lower/dangle his legs off of the tx table to decrease strain on his back.  He stated understanding.  Receiving Radiation Therapy for Prostate Cancer

## 2015-03-24 ENCOUNTER — Encounter: Payer: Self-pay | Admitting: Radiation Oncology

## 2015-03-24 ENCOUNTER — Ambulatory Visit
Admission: RE | Admit: 2015-03-24 | Discharge: 2015-03-24 | Disposition: A | Discharge status: Home / Self Care | Payer: Medicare Other | Source: Ambulatory Visit | Attending: Radiation Oncology | Admitting: Radiation Oncology

## 2015-03-24 VITALS — BP 143/93 | HR 75 | Temp 98.7°F | Ht 72.0 in | Wt 164.0 lb

## 2015-03-24 DIAGNOSIS — Z510 Encounter for antineoplastic radiation therapy: Secondary | ICD-10-CM | POA: Diagnosis not present

## 2015-03-24 DIAGNOSIS — C61 Malignant neoplasm of prostate: Secondary | ICD-10-CM

## 2015-03-24 NOTE — Progress Notes (Signed)
Weekly Management Note:  Site: prostate /SV/pelvic lymph nodes Current Dose:   780  cGy Projected Dose:  7800  cGy  Narrative: The patient is seen today for routine under treatment assessment. CBCT/MVCT images/port films were reviewed. The chart was reviewed.    Bladder filling is satisfactory.  Physical Examination:  Filed Vitals:   03/24/15 0935  BP: 143/93  Pulse: 75  Temp: 98.7 F (37.1 C)  .  Weight: 164 lb (74.39 kg).  No change.  Impression: Tolerating radiation therapy well.  Dr. Tammi Klippel will assume his care.  Plan: Continue radiation therapy as planned.

## 2015-03-24 NOTE — Progress Notes (Signed)
Pt here for patient teaching.  Pt given Radiation and You booklet and skin care instructions. Pt reports they have not watched the Radiation Therapy Education video on .  Reviewed areas of pertinence such as diarrhea, fatigue, sexual and fertility changes, skin changes and urinary and bladder changes . Pt able to give teach back of to pat skin, use unscented/gentle soap, use baby wipes, have Imodium on hand, drink plenty of water and sitz bath,apply Radiaplex bid, apply Sonafine bid, avoid applying anything to skin within 4 hours of treatment, avoid wearing an under wire bra and to use an electric razor if they must shave. Pt demonstrated understanding and verbalizes understanding of information given and will contact nursing with any questions or concerns.     Http://rtanswers.org/treatmentinformation/whattoexpect/index - Given Link for patient and family

## 2015-03-24 NOTE — Progress Notes (Signed)
Anthony Duffy has received 4 Fractions to his pelvis for prostate cancer.  He reports no changes at this time.  Nocturia x 3 which is his normal

## 2015-03-24 NOTE — Addendum Note (Signed)
Encounter addended by: Benn Moulder, RN on: 03/24/2015  6:00 PM<BR>     Documentation filed: Inpatient Patient Education, Notes Section

## 2015-03-25 ENCOUNTER — Encounter: Payer: Self-pay | Admitting: Radiation Oncology

## 2015-03-25 ENCOUNTER — Ambulatory Visit
Admission: RE | Admit: 2015-03-25 | Discharge: 2015-03-25 | Disposition: A | Discharge status: Home / Self Care | Payer: Medicare Other | Source: Ambulatory Visit | Attending: Radiation Oncology | Admitting: Radiation Oncology

## 2015-03-25 DIAGNOSIS — Z510 Encounter for antineoplastic radiation therapy: Secondary | ICD-10-CM | POA: Diagnosis not present

## 2015-03-26 ENCOUNTER — Ambulatory Visit
Admission: RE | Admit: 2015-03-26 | Discharge: 2015-03-26 | Disposition: A | Discharge status: Home / Self Care | Payer: Medicare Other | Source: Ambulatory Visit | Attending: Radiation Oncology | Admitting: Radiation Oncology

## 2015-03-26 DIAGNOSIS — Z510 Encounter for antineoplastic radiation therapy: Secondary | ICD-10-CM | POA: Diagnosis not present

## 2015-03-27 ENCOUNTER — Ambulatory Visit
Admission: RE | Admit: 2015-03-27 | Discharge: 2015-03-27 | Disposition: A | Discharge status: Home / Self Care | Payer: Medicare Other | Source: Ambulatory Visit | Attending: Radiation Oncology | Admitting: Radiation Oncology

## 2015-03-27 DIAGNOSIS — Z510 Encounter for antineoplastic radiation therapy: Secondary | ICD-10-CM | POA: Diagnosis not present

## 2015-03-28 ENCOUNTER — Ambulatory Visit
Admission: RE | Admit: 2015-03-28 | Discharge: 2015-03-28 | Disposition: A | Discharge status: Home / Self Care | Payer: Medicare Other | Source: Ambulatory Visit | Attending: Radiation Oncology | Admitting: Radiation Oncology

## 2015-03-28 ENCOUNTER — Encounter: Payer: Self-pay | Admitting: Radiation Oncology

## 2015-03-28 ENCOUNTER — Ambulatory Visit: Admission: RE | Admit: 2015-03-28 | Payer: Medicare Other | Source: Ambulatory Visit | Admitting: Radiation Oncology

## 2015-03-28 ENCOUNTER — Encounter: Payer: Self-pay | Admitting: Medical Oncology

## 2015-03-28 VITALS — BP 132/71 | HR 67 | Temp 97.5°F | Resp 16 | Wt 168.9 lb

## 2015-03-28 DIAGNOSIS — Z510 Encounter for antineoplastic radiation therapy: Secondary | ICD-10-CM | POA: Diagnosis not present

## 2015-03-28 DIAGNOSIS — C61 Malignant neoplasm of prostate: Secondary | ICD-10-CM

## 2015-03-28 NOTE — Progress Notes (Signed)
   Department of Radiation Oncology  Phone:  [phone] Fax:        [phone]  Weekly Treatment Note    Name: Anthony Duffy Date: 03/28/2015 MRN: [id number] DOB: [birth year]   Diagnosis:     ICD-9-CM ICD-10-CM   1. Malignant neoplasm of prostate ([place]) 185 C61      Current dose: 15.6 Gy  Current fraction:8   MEDICATIONS: Current Outpatient Prescriptions  Medication Sig Dispense Refill  . albuterol (PROVENTIL HFA;VENTOLIN HFA) 108 (90 BASE) MCG/ACT inhaler Inhale 2 puffs into the lungs every 4 (four) hours as needed for wheezing. 1 Inhaler 1  . albuterol (PROVENTIL) (2.5 MG/3ML) 0.083% nebulizer solution Take 3 mLs (2.5 mg total) by nebulization every 6 (six) hours as needed for wheezing or shortness of breath. 25 mL 0  . amLODipine (NORVASC) 10 MG tablet Take 10 mg by mouth daily.    Marland Kitchen ipratropium-albuterol (DUONEB) 0.5-2.5 (3) MG/3ML SOLN Take 3 mLs by nebulization every 6 (six) hours as needed. 360 mL 0  . lisinopril-hydrochlorothiazide (PRINZIDE,ZESTORETIC) 10-12.5 MG per tablet Take 1 tablet by mouth daily.    . meloxicam (MOBIC) 15 MG tablet Take 15 mg by mouth daily.    Marland Kitchen selenium sulfide (SELSUN) 2.5 % shampoo Apply topically 2 (two) times a week. 120 mL 1  . chlorthalidone (HYGROTON) 25 MG tablet Take 0.5 tablets (12.5 mg total) by mouth daily. 30 tablet 1   No current facility-administered medications for this encounter.     ALLERGIES: Review of patient's allergies indicates no known allergies.   LABORATORY DATA:  Lab Results  Component Value Date   WBC 7.5 12/29/2011   HGB 14.9 12/29/2011   HCT 44.5 12/29/2011   MCV 84.3 12/29/2011   PLT 286 12/29/2011   Lab Results  Component Value Date   NA 138 12/29/2011   K 4.1 12/29/2011   CL 99 12/29/2011   CO2 30 12/29/2011   No results found for: ALT, AST, GGT, ALKPHOS, BILITOT   NARRATIVE: Anthony Duffy was seen today for weekly treatment management. The chart was checked and the patient's films were  reviewed.  Weekly rad tx prostate 8 completed. He has urinary urgency and nocturia x4-5. He reports a good stream and no other issues.   PHYSICAL EXAMINATION: weight is 168 lb 14.4 oz (76.613 kg). His oral temperature is 97.5 F (36.4 C). His blood pressure is 132/71 and his pulse is 67. His respiration is 16.  Alert and oriented x3.  ASSESSMENT: The patient is doing satisfactorily with treatment.  PLAN: We will continue with the patient's radiation treatment as planned.  This document serves as a record of services personally performed by Kyung Rudd, MD. It was created on his behalf by Darcus Austin, a trained medical scribe. The creation of this record is based on the scribe's personal observations and the provider's statements to them. This document has been checked and approved by the attending provider.

## 2015-03-28 NOTE — Progress Notes (Signed)
Weekly rad tx prostate 8 completed,, has urgency,nocturia 4-5x, good stream, no other issues  9:03 AM BP 132/71 mmHg  Pulse 67  Temp(Src) 97.5 F (36.4 C) (Oral)  Resp 16  Wt 168 lb 14.4 oz (76.613 kg)  Wt Readings from Last 3 Encounters:  03/28/15 168 lb 14.4 oz (76.613 kg)  03/24/15 164 lb (74.39 kg)  01/30/15 164 lb 11.2 oz (74.707 kg)

## 2015-04-01 ENCOUNTER — Ambulatory Visit
Admission: RE | Admit: 2015-04-01 | Discharge: 2015-04-01 | Disposition: A | Discharge status: Home / Self Care | Payer: Medicare Other | Source: Ambulatory Visit | Attending: Radiation Oncology | Admitting: Radiation Oncology

## 2015-04-01 DIAGNOSIS — Z510 Encounter for antineoplastic radiation therapy: Secondary | ICD-10-CM | POA: Diagnosis not present

## 2015-04-02 ENCOUNTER — Ambulatory Visit
Admission: RE | Admit: 2015-04-02 | Discharge: 2015-04-02 | Disposition: A | Discharge status: Home / Self Care | Payer: Medicare Other | Source: Ambulatory Visit | Attending: Radiation Oncology | Admitting: Radiation Oncology

## 2015-04-02 DIAGNOSIS — Z510 Encounter for antineoplastic radiation therapy: Secondary | ICD-10-CM | POA: Diagnosis not present

## 2015-04-03 ENCOUNTER — Ambulatory Visit
Admission: RE | Admit: 2015-04-03 | Discharge: 2015-04-03 | Disposition: A | Discharge status: Home / Self Care | Payer: Medicare Other | Source: Ambulatory Visit | Attending: Radiation Oncology | Admitting: Radiation Oncology

## 2015-04-03 DIAGNOSIS — Z510 Encounter for antineoplastic radiation therapy: Secondary | ICD-10-CM | POA: Diagnosis not present

## 2015-04-04 ENCOUNTER — Ambulatory Visit
Admission: RE | Admit: 2015-04-04 | Discharge: 2015-04-04 | Disposition: A | Discharge status: Home / Self Care | Payer: Medicare Other | Source: Ambulatory Visit | Attending: Radiation Oncology | Admitting: Radiation Oncology

## 2015-04-04 ENCOUNTER — Encounter: Payer: Self-pay | Admitting: Radiation Oncology

## 2015-04-04 VITALS — BP 144/72 | HR 70 | Temp 98.1°F | Ht 72.0 in | Wt 165.4 lb

## 2015-04-04 DIAGNOSIS — Z510 Encounter for antineoplastic radiation therapy: Secondary | ICD-10-CM | POA: Diagnosis not present

## 2015-04-04 DIAGNOSIS — C61 Malignant neoplasm of prostate: Secondary | ICD-10-CM

## 2015-04-04 NOTE — Progress Notes (Signed)
  Radiation Oncology         [phone]   Name: Anthony Duffy MRN: [id number]   Date: 04/04/2015  DOB: [birth year]     Weekly Radiation Therapy Management     ICD-9-CM ICD-10-CM   1. Malignant neoplasm of prostate ([place]) 185 C61     Current Dose: 23.4 Gy  Planned Dose:  78 Gy  Narrative The patient presents for routine under treatment assessment.  Anthony Duffy states the only change in urination is more frequency to void. No other concerns.  The patient is without complaint. Set-up films were reviewed. The chart was checked.  Physical Findings  height is 6' (1.829 m) and weight is 165 lb 6.4 oz (75.025 kg). His temperature is 98.1 F (36.7 C). His blood pressure is 144/72 and his pulse is 70. . Weight essentially stable.  No significant changes.  Impression The patient is tolerating radiation.  Plan Continue treatment as planned.         Sheral Apley Tammi Klippel, M.D.  This document serves as a record of services personally performed by Tyler Pita, MD. It was created on his behalf by Lendon Collar, a trained medical scribe. The creation of this record is based on the scribe's personal observations and the provider's statements to them. This document has been checked and approved by the attending provider.

## 2015-04-04 NOTE — Progress Notes (Signed)
Mr. Samborski states the only change in urination is more frequency to void.  No other concerns.

## 2015-04-08 ENCOUNTER — Ambulatory Visit
Admission: RE | Admit: 2015-04-08 | Discharge: 2015-04-08 | Disposition: A | Discharge status: Home / Self Care | Payer: Medicare Other | Source: Ambulatory Visit | Attending: Radiation Oncology | Admitting: Radiation Oncology

## 2015-04-08 DIAGNOSIS — R351 Nocturia: Secondary | ICD-10-CM | POA: Diagnosis not present

## 2015-04-08 DIAGNOSIS — Z510 Encounter for antineoplastic radiation therapy: Secondary | ICD-10-CM | POA: Diagnosis not present

## 2015-04-08 DIAGNOSIS — R3915 Urgency of urination: Secondary | ICD-10-CM | POA: Diagnosis not present

## 2015-04-08 DIAGNOSIS — C61 Malignant neoplasm of prostate: Secondary | ICD-10-CM | POA: Diagnosis present

## 2015-04-09 ENCOUNTER — Encounter: Payer: Self-pay | Admitting: Medical Oncology

## 2015-04-09 ENCOUNTER — Ambulatory Visit
Admission: RE | Admit: 2015-04-09 | Discharge: 2015-04-09 | Disposition: A | Discharge status: Home / Self Care | Payer: Medicare Other | Source: Ambulatory Visit | Attending: Radiation Oncology | Admitting: Radiation Oncology

## 2015-04-09 DIAGNOSIS — Z510 Encounter for antineoplastic radiation therapy: Secondary | ICD-10-CM | POA: Diagnosis not present

## 2015-04-10 ENCOUNTER — Ambulatory Visit
Admission: RE | Admit: 2015-04-10 | Discharge: 2015-04-10 | Disposition: A | Discharge status: Home / Self Care | Payer: Medicare Other | Source: Ambulatory Visit | Attending: Radiation Oncology | Admitting: Radiation Oncology

## 2015-04-10 DIAGNOSIS — Z510 Encounter for antineoplastic radiation therapy: Secondary | ICD-10-CM | POA: Diagnosis not present

## 2015-04-11 ENCOUNTER — Encounter: Payer: Self-pay | Admitting: Radiation Oncology

## 2015-04-11 ENCOUNTER — Ambulatory Visit
Admission: RE | Admit: 2015-04-11 | Discharge: 2015-04-11 | Disposition: A | Discharge status: Home / Self Care | Payer: Medicare Other | Source: Ambulatory Visit | Attending: Radiation Oncology | Admitting: Radiation Oncology

## 2015-04-11 VITALS — BP 150/89 | HR 61 | Resp 16 | Wt 167.7 lb

## 2015-04-11 DIAGNOSIS — C61 Malignant neoplasm of prostate: Secondary | ICD-10-CM

## 2015-04-11 DIAGNOSIS — Z510 Encounter for antineoplastic radiation therapy: Secondary | ICD-10-CM | POA: Diagnosis not present

## 2015-04-11 NOTE — Progress Notes (Signed)
  Radiation Oncology         [phone]   Name: Anthony Duffy MRN: [id number]   Date: 04/11/2015  DOB: 1940/06/28     Weekly Radiation Therapy Management     ICD-9-CM ICD-10-CM   1. 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  Weight and vitals stable. Denies pain. Reports a weak urine stream. Denies dysuria or hematuria. Reports nocturia x 5. Reports urgency. Denies incontinence or leakage. Denies diarrhea. Denies fatigue.   The patient is without complaint. Set-up films were reviewed. The chart was checked.  Physical Findings  weight is 167 lb 11.2 oz (76.068 kg). His blood pressure is 150/89 and his pulse is 61. His respiration is 16 and oxygen saturation is 100%. . Weight essentially stable.  No significant changes.  Impression The patient is tolerating radiation.  Plan Continue treatment as planned.         Sheral Apley Tammi Klippel, M.D.  This document serves as a record of services personally performed by Tyler Pita, MD. It was created on his behalf by Arlyce Harman, a trained medical scribe. The creation of this record is based on the scribe's personal observations and the provider's statements to them. This document has been checked and approved by the attending provider.

## 2015-04-11 NOTE — Progress Notes (Signed)
Weight and vitals stable. Denies pain. Reports a weak urine stream. Denies dysuria or hematuria. Reports nocturia x 5. Reports urgency. Denies incontinence or leakage. Denies diarrhea.  Denies fatigue.   BP 150/89 mmHg  Pulse 61  Resp 16  Wt 167 lb 11.2 oz (76.068 kg)  SpO2 100% Wt Readings from Last 3 Encounters:  04/11/15 167 lb 11.2 oz (76.068 kg)  04/04/15 165 lb 6.4 oz (75.025 kg)  03/28/15 168 lb 14.4 oz (76.613 kg)

## 2015-04-14 ENCOUNTER — Ambulatory Visit
Admission: RE | Admit: 2015-04-14 | Discharge: 2015-04-14 | Disposition: A | Discharge status: Home / Self Care | Payer: Medicare Other | Source: Ambulatory Visit | Attending: Radiation Oncology | Admitting: Radiation Oncology

## 2015-04-14 DIAGNOSIS — Z510 Encounter for antineoplastic radiation therapy: Secondary | ICD-10-CM | POA: Diagnosis not present

## 2015-04-15 ENCOUNTER — Ambulatory Visit
Admission: RE | Admit: 2015-04-15 | Discharge: 2015-04-15 | Disposition: A | Discharge status: Home / Self Care | Payer: Medicare Other | Source: Ambulatory Visit | Attending: Radiation Oncology | Admitting: Radiation Oncology

## 2015-04-15 DIAGNOSIS — Z510 Encounter for antineoplastic radiation therapy: Secondary | ICD-10-CM | POA: Diagnosis not present

## 2015-04-16 ENCOUNTER — Ambulatory Visit
Admission: RE | Admit: 2015-04-16 | Discharge: 2015-04-16 | Disposition: A | Discharge status: Home / Self Care | Payer: Medicare Other | Source: Ambulatory Visit | Attending: Radiation Oncology | Admitting: Radiation Oncology

## 2015-04-16 DIAGNOSIS — Z510 Encounter for antineoplastic radiation therapy: Secondary | ICD-10-CM | POA: Diagnosis not present

## 2015-04-17 ENCOUNTER — Ambulatory Visit
Admission: RE | Admit: 2015-04-17 | Discharge: 2015-04-17 | Disposition: A | Discharge status: Home / Self Care | Payer: Medicare Other | Source: Ambulatory Visit | Attending: Radiation Oncology | Admitting: Radiation Oncology

## 2015-04-17 VITALS — BP 155/81 | HR 74 | Resp 16 | Wt 167.0 lb

## 2015-04-17 DIAGNOSIS — Z510 Encounter for antineoplastic radiation therapy: Secondary | ICD-10-CM | POA: Diagnosis not present

## 2015-04-17 DIAGNOSIS — C61 Malignant neoplasm of prostate: Secondary | ICD-10-CM

## 2015-04-17 NOTE — Progress Notes (Signed)
Weight and vitals stable. Denies pain. Reports a weak urine stream. Denies dysuria or hematuria. Reports nocturia x 5. Denies incontinence or leakage. Denies intermittent episodes of diarrhea. Denies fatigue.   BP 155/81 mmHg  Pulse 74  Resp 16  Wt 167 lb (75.751 kg)  SpO2 100% Wt Readings from Last 3 Encounters:  04/17/15 167 lb (75.751 kg)  04/11/15 167 lb 11.2 oz (76.068 kg)  04/04/15 165 lb 6.4 oz (75.025 kg)

## 2015-04-17 NOTE — Progress Notes (Signed)
  Radiation Oncology         [phone]   Name: Anthony Duffy MRN: [id number]   Date: 04/17/2015  DOB: 1940-05-01     Weekly Radiation Therapy Management     ICD-9-CM ICD-10-CM   1. Malignant neoplasm of prostate ([facility]) 185 C61     Current Dose: 39 Gy  Planned Dose:  78 Gy  Narrative The patient presents for routine under treatment assessment.  Weight and vitals stable. Denies pain. Reports a weak urine stream. Denies dysuria or hematuria. Reports nocturia x 5. Denies incontinence or leakage. Denies intermittent episodes of diarrhea. Denies fatigue.  The patient is without complaint. Set-up films were reviewed. The chart was checked.  Physical Findings  weight is 167 lb (75.751 kg). His blood pressure is 155/81 and his pulse is 74. His respiration is 16 and oxygen saturation is 100%. . Weight essentially stable.  No significant changes.  Impression The patient is tolerating radiation.  Plan Continue treatment as planned. I recommended that if the nocturia starts causing him sleep disturbances, I could prescribe him Flomax to help alleviate it. Currently, he does not want to take Flomax.         Sheral Apley Tammi Klippel, M.D.  This document serves as a record of services personally performed by Tyler Pita, MD. It was created on his behalf by Lendon Collar, a trained medical scribe. The creation of this record is based on the scribe's personal observations and the provider's statements to them. This document has been checked and approved by the attending provider.

## 2015-04-18 ENCOUNTER — Ambulatory Visit
Admission: RE | Admit: 2015-04-18 | Discharge: 2015-04-18 | Disposition: A | Discharge status: Home / Self Care | Payer: Medicare Other | Source: Ambulatory Visit | Attending: Radiation Oncology | Admitting: Radiation Oncology

## 2015-04-18 DIAGNOSIS — Z510 Encounter for antineoplastic radiation therapy: Secondary | ICD-10-CM | POA: Diagnosis not present

## 2015-04-21 ENCOUNTER — Ambulatory Visit
Admission: RE | Admit: 2015-04-21 | Discharge: 2015-04-21 | Disposition: A | Discharge status: Home / Self Care | Payer: Medicare Other | Source: Ambulatory Visit | Attending: Radiation Oncology | Admitting: Radiation Oncology

## 2015-04-21 DIAGNOSIS — Z510 Encounter for antineoplastic radiation therapy: Secondary | ICD-10-CM | POA: Diagnosis not present

## 2015-04-22 ENCOUNTER — Ambulatory Visit
Admission: RE | Admit: 2015-04-22 | Discharge: 2015-04-22 | Disposition: A | Discharge status: Home / Self Care | Payer: Medicare Other | Source: Ambulatory Visit | Attending: Radiation Oncology | Admitting: Radiation Oncology

## 2015-04-22 DIAGNOSIS — Z510 Encounter for antineoplastic radiation therapy: Secondary | ICD-10-CM | POA: Diagnosis not present

## 2015-04-23 ENCOUNTER — Ambulatory Visit
Admission: RE | Admit: 2015-04-23 | Discharge: 2015-04-23 | Disposition: A | Discharge status: Home / Self Care | Payer: Medicare Other | Source: Ambulatory Visit | Attending: Radiation Oncology | Admitting: Radiation Oncology

## 2015-04-23 ENCOUNTER — Encounter: Payer: Self-pay | Admitting: Medical Oncology

## 2015-04-23 DIAGNOSIS — Z510 Encounter for antineoplastic radiation therapy: Secondary | ICD-10-CM | POA: Diagnosis not present

## 2015-04-24 ENCOUNTER — Ambulatory Visit
Admission: RE | Admit: 2015-04-24 | Discharge: 2015-04-24 | Disposition: A | Discharge status: Home / Self Care | Payer: Medicare Other | Source: Ambulatory Visit | Attending: Radiation Oncology | Admitting: Radiation Oncology

## 2015-04-24 VITALS — BP 118/69 | HR 59 | Resp 16 | Wt 165.0 lb

## 2015-04-24 DIAGNOSIS — Z510 Encounter for antineoplastic radiation therapy: Secondary | ICD-10-CM | POA: Diagnosis not present

## 2015-04-24 DIAGNOSIS — C61 Malignant neoplasm of prostate: Secondary | ICD-10-CM

## 2015-04-24 NOTE — Progress Notes (Signed)
  Radiation Oncology         [phone]   Name: Anthony Duffy MRN: [id number]   Date: 04/24/2015  DOB: Aug 11, 1940     Weekly Radiation Therapy Management     ICD-9-CM ICD-10-CM   1. Malignant neoplasm of prostate ([facility]) 185 C61     Current Dose: 48.75 Gy  Planned Dose:  78 Gy  Narrative The patient presents for routine under treatment assessment.  Vitals stable. Denies pain. Reports a weak urine stream continues. Denies dysuria or hematuria. Reports nocturia x 3-5. Denies incontinence or leakage. Reports diarrhea. Denies rectal irritation. Denies fatigue. Began new arthritis medication. Will bring medication to treatment tomorrow to show the nurse.   Set-up films were reviewed. The chart was checked.  Physical Findings  weight is 165 lb (74.844 kg). His blood pressure is 118/69 and his pulse is 59. His respiration is 16 and oxygen saturation is 100%. . Weight essentially stable.  No significant changes.  Impression The patient is tolerating radiation.  Plan Continue treatment as planned.         Sheral Apley Tammi Klippel, M.D.  This document serves as a record of services personally performed by Tyler Pita, MD. It was created on his behalf by Jenell Milliner, a trained medical scribe. The creation of this record is based on the scribe's personal observations and the provider's statements to them. This document has been checked and approved by the attending provider.

## 2015-04-24 NOTE — Progress Notes (Signed)
Weight and vitals stable. Denies pain. Reports a weak urine stream continues. Denies dysuria or hematuria. Reports nocturia x 3-5. Denies incontinence or leakage. Reports diarrhea. Denies rectal irritation. Denies fatigue.  BP 118/69 mmHg  Pulse 59  Resp 16  Wt 165 lb (74.844 kg)  SpO2 100% Wt Readings from Last 3 Encounters:  04/24/15 165 lb (74.844 kg)  04/17/15 167 lb (75.751 kg)  04/11/15 167 lb 11.2 oz (76.068 kg)

## 2015-04-25 ENCOUNTER — Ambulatory Visit
Admission: RE | Admit: 2015-04-25 | Discharge: 2015-04-25 | Disposition: A | Discharge status: Home / Self Care | Payer: Medicare Other | Source: Ambulatory Visit | Attending: Radiation Oncology | Admitting: Radiation Oncology

## 2015-04-25 ENCOUNTER — Telehealth: Payer: Self-pay | Admitting: Radiation Oncology

## 2015-04-25 DIAGNOSIS — Z510 Encounter for antineoplastic radiation therapy: Secondary | ICD-10-CM | POA: Diagnosis not present

## 2015-04-25 NOTE — Telephone Encounter (Signed)
[facility] and spoke with nursing. Apparently the patient was ordered to receive Lupron however has not received a set. Dr. Kellie Simmering has since retired. 1 of the other attendings and the nurse who worked with Dr. Kellie Simmering will review this and get back with [place].

## 2015-04-28 ENCOUNTER — Ambulatory Visit: Payer: Medicare Other

## 2015-04-28 ENCOUNTER — Ambulatory Visit
Admission: RE | Admit: 2015-04-28 | Discharge: 2015-04-28 | Disposition: A | Discharge status: Home / Self Care | Payer: Medicare Other | Source: Ambulatory Visit | Attending: Radiation Oncology | Admitting: Radiation Oncology

## 2015-04-29 ENCOUNTER — Ambulatory Visit
Admission: RE | Admit: 2015-04-29 | Discharge: 2015-04-29 | Disposition: A | Discharge status: Home / Self Care | Payer: Medicare Other | Source: Ambulatory Visit | Attending: Radiation Oncology | Admitting: Radiation Oncology

## 2015-04-29 DIAGNOSIS — Z510 Encounter for antineoplastic radiation therapy: Secondary | ICD-10-CM | POA: Diagnosis not present

## 2015-04-30 ENCOUNTER — Ambulatory Visit
Admission: RE | Admit: 2015-04-30 | Discharge: 2015-04-30 | Disposition: A | Discharge status: Home / Self Care | Payer: Medicare Other | Source: Ambulatory Visit | Attending: Radiation Oncology | Admitting: Radiation Oncology

## 2015-04-30 DIAGNOSIS — Z510 Encounter for antineoplastic radiation therapy: Secondary | ICD-10-CM | POA: Diagnosis not present

## 2015-05-01 ENCOUNTER — Encounter: Payer: Self-pay | Admitting: Radiation Oncology

## 2015-05-01 ENCOUNTER — Ambulatory Visit
Admission: RE | Admit: 2015-05-01 | Discharge: 2015-05-01 | Disposition: A | Discharge status: Home / Self Care | Payer: Medicare Other | Source: Ambulatory Visit | Attending: Radiation Oncology | Admitting: Radiation Oncology

## 2015-05-01 VITALS — BP 124/63 | HR 60 | Temp 98.0°F | Resp 16 | Wt 166.7 lb

## 2015-05-01 DIAGNOSIS — C61 Malignant neoplasm of prostate: Secondary | ICD-10-CM

## 2015-05-01 DIAGNOSIS — Z510 Encounter for antineoplastic radiation therapy: Secondary | ICD-10-CM | POA: Diagnosis not present

## 2015-05-01 NOTE — Progress Notes (Signed)
Weekly rad tx  Prostate  29/40 completed, nocturia 4-5x, urgency when voiding slow stream,  No dysuria stated,at times has diarrhea, appetite, no pain, drinking plenty water stated before coming for treatment, no fatigue  9:15 AM BP 124/63 mmHg  Pulse 60  Temp(Src) 98 F (36.7 C) (Oral)  Resp 16  Wt 166 lb 11.2 oz (75.615 kg)  Wt Readings from Last 3 Encounters:  05/01/15 166 lb 11.2 oz (75.615 kg)  04/24/15 165 lb (74.844 kg)  04/17/15 167 lb (75.751 kg)

## 2015-05-01 NOTE — Progress Notes (Signed)
.  mm Radiation Oncology         [phone]) [phone]   Name: Anthony Duffy MRN: [id number]   Date: 05/01/2015  DOB: 04-10-1940     Weekly Radiation Therapy Management     ICD-9-CM ICD-10-CM   1. Malignant neoplasm of prostate ([facility]) 185 C61     Current Dose: 56.55 Gy  Planned Dose:  78 Gy  Narrative The patient presents for routine under treatment assessment.  Reports nocturia 4-5x, urgency when voiding, slow stream, and occasional diarrhea mostly present in the mornings. No dysuria stated. Denies poor appetite, fatigue, and pain. Patient has been drinking plenty of water (stated before coming for treatment).  Set-up films were reviewed. The chart was checked.  Physical Findings  weight is 166 lb 11.2 oz (75.615 kg). His oral temperature is 98 F (36.7 C). His blood pressure is 124/63 and his pulse is 60. His respiration is 16.  Weight essentially stable.  No significant changes.  Impression The patient is tolerating radiation.  Plan Continue treatment as planned. Suggested dietary changes (low fiber diet) and OTC Imodium AD for diarrhea.         Sheral Apley Tammi Klippel, M.D.  This document serves as a record of services personally performed by Tyler Pita, MD. It was created on his behalf by Jenell Milliner, a trained medical scribe. The creation of this record is based on the scribe's personal observations and the provider's statements to them. This document has been checked and approved by the attending provider.

## 2015-05-02 ENCOUNTER — Ambulatory Visit
Admission: RE | Admit: 2015-05-02 | Discharge: 2015-05-02 | Disposition: A | Discharge status: Home / Self Care | Payer: Medicare Other | Source: Ambulatory Visit | Attending: Radiation Oncology | Admitting: Radiation Oncology

## 2015-05-02 DIAGNOSIS — Z510 Encounter for antineoplastic radiation therapy: Secondary | ICD-10-CM | POA: Diagnosis not present

## 2015-05-05 ENCOUNTER — Ambulatory Visit
Admission: RE | Admit: 2015-05-05 | Discharge: 2015-05-05 | Disposition: A | Discharge status: Home / Self Care | Payer: Medicare Other | Source: Ambulatory Visit | Attending: Radiation Oncology | Admitting: Radiation Oncology

## 2015-05-05 DIAGNOSIS — Z510 Encounter for antineoplastic radiation therapy: Secondary | ICD-10-CM | POA: Diagnosis not present

## 2015-05-06 ENCOUNTER — Ambulatory Visit
Admission: RE | Admit: 2015-05-06 | Discharge: 2015-05-06 | Disposition: A | Discharge status: Home / Self Care | Payer: Medicare Other | Source: Ambulatory Visit | Attending: Radiation Oncology | Admitting: Radiation Oncology

## 2015-05-06 DIAGNOSIS — Z510 Encounter for antineoplastic radiation therapy: Secondary | ICD-10-CM | POA: Diagnosis not present

## 2015-05-07 ENCOUNTER — Ambulatory Visit
Admission: RE | Admit: 2015-05-07 | Discharge: 2015-05-07 | Disposition: A | Discharge status: Home / Self Care | Payer: Medicare Other | Source: Ambulatory Visit | Attending: Radiation Oncology | Admitting: Radiation Oncology

## 2015-05-07 DIAGNOSIS — Z510 Encounter for antineoplastic radiation therapy: Secondary | ICD-10-CM | POA: Insufficient documentation

## 2015-05-07 DIAGNOSIS — C61 Malignant neoplasm of prostate: Secondary | ICD-10-CM | POA: Insufficient documentation

## 2015-05-08 ENCOUNTER — Ambulatory Visit
Admission: RE | Admit: 2015-05-08 | Discharge: 2015-05-08 | Disposition: A | Discharge status: Home / Self Care | Payer: Medicare Other | Source: Ambulatory Visit | Attending: Radiation Oncology | Admitting: Radiation Oncology

## 2015-05-08 DIAGNOSIS — Z510 Encounter for antineoplastic radiation therapy: Secondary | ICD-10-CM | POA: Diagnosis not present

## 2015-05-09 ENCOUNTER — Ambulatory Visit
Admission: RE | Admit: 2015-05-09 | Discharge: 2015-05-09 | Disposition: A | Discharge status: Home / Self Care | Payer: Medicare Other | Source: Ambulatory Visit | Attending: Radiation Oncology | Admitting: Radiation Oncology

## 2015-05-09 VITALS — BP 136/74 | HR 69 | Resp 16 | Wt 167.4 lb

## 2015-05-09 DIAGNOSIS — Z510 Encounter for antineoplastic radiation therapy: Secondary | ICD-10-CM | POA: Diagnosis not present

## 2015-05-09 DIAGNOSIS — C61 Malignant neoplasm of prostate: Secondary | ICD-10-CM

## 2015-05-09 NOTE — Progress Notes (Signed)
Weight and vitals stable. Denies pain. Reports urinary urgency is less. Denies urinary frequency. Reports nocturia is now less than four. Reports diarrhea is well managed. Reports using baby wipes for mild rectal irritation. Denies fatigue.   BP 136/74 mmHg  Pulse 69  Resp 16  Wt 167 lb 6.4 oz (75.932 kg)  SpO2 100% Wt Readings from Last 3 Encounters:  05/09/15 167 lb 6.4 oz (75.932 kg)  05/01/15 166 lb 11.2 oz (75.615 kg)  04/24/15 165 lb (74.844 kg)

## 2015-05-09 NOTE — Progress Notes (Signed)
  Radiation Oncology         [phone]   Name: Anthony Duffy MRN: [id number]   Date: 05/09/2015  DOB: 21-Dec-1940     Weekly Radiation Therapy Management     ICD-9-CM ICD-10-CM   1. 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  Weight and vitals stable. Denies pain. Reports urinary urgency is less. Denies urinary frequency. Reports nocturia is now less than four. Reports diarrhea is well managed. Reports using baby wipes for mild rectal irritation. Denies fatigue.  Set-up films were reviewed. The chart was checked.  Physical Findings  weight is 167 lb 6.4 oz (75.932 kg). His blood pressure is 136/74 and his pulse is 69. His respiration is 16 and oxygen saturation is 100%.  Weight essentially stable.  No significant changes.  Impression The patient is tolerating radiation.  Plan Continue treatment as planned.          Sheral Apley Tammi Klippel, M.D.  This document serves as a record of services personally performed by Tyler Pita, MD. It was created on his behalf by Arlyce Harman, a trained medical scribe. The creation of this record is based on the scribe's personal observations and the provider's statements to them. This document has been checked and approved by the attending provider.

## 2015-05-12 ENCOUNTER — Ambulatory Visit
Admission: RE | Admit: 2015-05-12 | Discharge: 2015-05-12 | Disposition: A | Discharge status: Home / Self Care | Payer: Medicare Other | Source: Ambulatory Visit | Attending: Radiation Oncology | Admitting: Radiation Oncology

## 2015-05-12 DIAGNOSIS — Z510 Encounter for antineoplastic radiation therapy: Secondary | ICD-10-CM | POA: Diagnosis not present

## 2015-05-13 ENCOUNTER — Ambulatory Visit
Admission: RE | Admit: 2015-05-13 | Discharge: 2015-05-13 | Disposition: A | Discharge status: Home / Self Care | Payer: Medicare Other | Source: Ambulatory Visit | Attending: Radiation Oncology | Admitting: Radiation Oncology

## 2015-05-13 DIAGNOSIS — Z510 Encounter for antineoplastic radiation therapy: Secondary | ICD-10-CM | POA: Diagnosis not present

## 2015-05-14 ENCOUNTER — Ambulatory Visit
Admission: RE | Admit: 2015-05-14 | Discharge: 2015-05-14 | Disposition: A | Discharge status: Home / Self Care | Payer: Medicare Other | Source: Ambulatory Visit | Attending: Radiation Oncology | Admitting: Radiation Oncology

## 2015-05-14 DIAGNOSIS — Z510 Encounter for antineoplastic radiation therapy: Secondary | ICD-10-CM | POA: Diagnosis not present

## 2015-05-15 ENCOUNTER — Ambulatory Visit
Admission: RE | Admit: 2015-05-15 | Discharge: 2015-05-15 | Disposition: A | Discharge status: Home / Self Care | Payer: Medicare Other | Source: Ambulatory Visit | Attending: Radiation Oncology | Admitting: Radiation Oncology

## 2015-05-15 ENCOUNTER — Ambulatory Visit: Payer: Medicare Other

## 2015-05-15 VITALS — BP 139/70 | HR 76 | Resp 16 | Wt 166.7 lb

## 2015-05-15 DIAGNOSIS — C61 Malignant neoplasm of prostate: Secondary | ICD-10-CM

## 2015-05-15 DIAGNOSIS — Z510 Encounter for antineoplastic radiation therapy: Secondary | ICD-10-CM | POA: Diagnosis not present

## 2015-05-15 NOTE — Progress Notes (Signed)
  Radiation Oncology         [phone] ________________________________  Name: Anthony Duffy  MRN: [id number]  Date: 05/15/2015  DOB: 10-13-1940  Weekly Radiation Therapy Management    ICD-9-CM ICD-10-CM   1. 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 . . . . . . . The patient presents for the final under treatment assessment.   Weight and vitals stable. Denies pain. Reports urinary urgency is mild. Denis frequency. Reports nocturia x 2-3. Reports diarrhea and rectal irritation are well managed. Denies fatigue. One month follow up appointment card given.                                   Set-up films were reviewed.                                 The chart was checked. Physical Findings. . . Weight essentially stable.  No significant changes. Impression . . . . . . . The patient tolerated radiation relatively well. Plan . . . . . . . . . . . . Complete radiation today as scheduled, and follow-up in one month. The patient was encouraged to call or return to the clinic in the interim for any worsening symptoms.  ________________________________  Sheral Apley Tammi Klippel, M.D.  This document serves as a record of services personally performed by Tyler Pita, MD. It was created on his behalf by Derek Mound, a trained medical scribe. The creation of this record is based on the scribe's personal observations and the provider's statements to them. This document has been checked and approved by the attending provider.

## 2015-05-15 NOTE — Progress Notes (Signed)
Weight and vitals stable. Denies pain. Reports urinary urgency is mild. Denis frequency. Reports nocturia x 2-3. Reports diarrhea and rectal irritation are well managed. Denies fatigue. One month follow up appointment card given.   BP 139/70 mmHg  Pulse 76  Resp 16  Wt 166 lb 11.2 oz (75.615 kg)  SpO2 100% Wt Readings from Last 3 Encounters:  05/15/15 166 lb 11.2 oz (75.615 kg)  05/09/15 167 lb 6.4 oz (75.932 kg)  05/01/15 166 lb 11.2 oz (75.615 kg)

## 2015-05-16 ENCOUNTER — Ambulatory Visit: Payer: Medicare Other

## 2015-05-16 ENCOUNTER — Encounter: Payer: Self-pay | Admitting: Radiation Oncology

## 2015-05-16 DIAGNOSIS — Z510 Encounter for antineoplastic radiation therapy: Secondary | ICD-10-CM | POA: Diagnosis not present

## 2015-05-27 ENCOUNTER — Telehealth: Payer: Self-pay | Admitting: Medical Oncology

## 2015-05-27 NOTE — Telephone Encounter (Signed)
Oncology Nurse Navigator Documentation  Oncology Nurse Navigator Flowsheets 04/09/2015 04/23/2015 05/27/2015  Navigator Encounter Type Treatment Treatment Telephone  Telephone - - Incoming Call- Mr. Turnquist called stating that last week he had noted light blood tinged urine for a day and then his urine cleared. He did not call because he knows this can be a side effect post radiation. Today he reports he has blood in his urine and it is obvious. He only see the blood when he voids. Pt needs to follow up with [facility] to be further evaluated. Dr. Tammi Klippel is out of the office. Pt stated he will call [facility] and see if he can be seen today. I called him back and [facility] will see him today at 1:30pm. I asked him to call me if I can further assist him or if he has other quesitons or concerns. He voiced understanding.  Treatment Phase Treatment Treatment -  Barriers/Navigation Needs No barriers at this time No barriers at this time -  Interventions - None required Coordination of Care  Coordination of Care - - Appts  Support Groups/Services Friends and Family Friends and Family -  Acuity - Level 1 Level 1  Acuity Level 1 - Minimal follow up required Minimal follow up required  Time Spent with Patient 15 15 15

## 2015-06-12 NOTE — Progress Notes (Signed)
  Radiation Oncology         [phone]) [phone] ________________________________  Name: Anthony Duffy  MRN: [id number]  Date: 05/16/2015  DOB: [birth year]  End of Treatment Note  Diagnosis:     ICD-9-CM ICD-10-CM   1. 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  Image guidance was performed with daily cone beam CT prior to each fraction to align to gold markers in the prostate and assure proper bladder and rectal fill volumes.  Immobilization was achieved with BodyFix custom mold.  Narrative: The patient tolerated radiation treatment relatively well.   The patient experienced some minor urinary irritation and modest fatigue.  Reports urinary urgency is mild. Denis frequency. Reports nocturia x 2-3. Reports diarrhea and rectal irritation are well managed.   Plan: The patient has completed radiation treatment. He will return to radiation oncology clinic for routine followup in one month. I advised him to call or return sooner if he has any questions or concerns related to his recovery or treatment. ________________________________  Sheral Apley. Tammi Klippel, M.D.  This document serves as a record of services personally performed by Tyler Pita, MD. It was created on his behalf by Arlyce Harman, a trained medical scribe. The creation of this record is based on the scribe's personal observations and the provider's statements to them. This document has been checked and approved by the attending provider.

## 2015-06-12 NOTE — Progress Notes (Deleted)
°  Radiation Oncology         [phone] ________________________________  Name: Anthony Duffy MRN: [id number]  Date: 05/16/2015  DOB: 08-17-1940  End of Treatment Note  Diagnosis:     ICD-9-CM ICD-10-CM   1. 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  Image guidance was performed with daily cone beam CT prior to each fraction to align to gold markers in the prostate and assure proper bladder and rectal fill volumes.  Immobilization was achieved with BodyFix custom mold.  Narrative: The patient tolerated radiation treatment relatively well.   The patient experienced some minor urinary irritation and modest fatigue.  Reports urinary urgency is mild. Denis frequency. Reports nocturia x 2-3. Reports diarrhea and rectal irritation are well managed.   Plan: The patient has completed radiation treatment. He will return to radiation oncology clinic for routine followup in one month. I advised him to call or return sooner if he has any questions or concerns related to his recovery or treatment. ________________________________  Sheral Apley. Tammi Klippel, M.D.  This document serves as a record of services personally performed by Tyler Pita, MD. It was created on his behalf by Arlyce Harman, a trained medical scribe. The creation of this record is based on the scribe's personal observations and the provider's statements to them. This document has been checked and approved by the attending provider.

## 2015-06-19 ENCOUNTER — Encounter: Payer: Self-pay | Admitting: Radiation Oncology

## 2015-06-19 ENCOUNTER — Encounter: Payer: Self-pay | Admitting: Medical Oncology

## 2015-06-19 ENCOUNTER — Ambulatory Visit
Admission: RE | Admit: 2015-06-19 | Discharge: 2015-06-19 | Disposition: A | Discharge status: Home / Self Care | Payer: Medicare Other | Source: Ambulatory Visit | Attending: Radiation Oncology | Admitting: Radiation Oncology

## 2015-06-19 VITALS — BP 160/76 | HR 74 | Resp 16 | Wt 171.3 lb

## 2015-06-19 DIAGNOSIS — C61 Malignant neoplasm of prostate: Secondary | ICD-10-CM

## 2015-06-19 NOTE — Progress Notes (Signed)
Radiation Oncology         [phone] ________________________________  Name: Anthony Duffy MRN: [id number]  Date: 06/19/2015  DOB: [birth year]  Follow-Up Visit Note  CC: Elizabeth Palau, MD  Lowella Bandy, MD  Diagnosis:   Malignant neoplasm of prostate [facility])     ICD-9-CM ICD-10-CM   1. 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 Since he completed radiation therapy, and he states that he is been doing pretty well overall. He was seen at [facility] urology for second Lupron injection about 3 weeks ago. He states that he is doing pretty well and managing his symptoms. He has noticed some persistent soreness along his rectum but denies any skin breakdown. He is using caps whites after having a bowel movement soon his skin. He states that this is most noticeable after having several bowel movements somewhat urgently after drinking coffee. He denies any blood per rectum when this occurs. He states that he continues to have 2-4 episodes of nocturia night, and states that he feels that this is improving. He denies any urinary incontinence or leakage. 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 He has noted some mild fatigue but also believes that this is improving. History he believes is improving in terms of strength, and though he still has to sometimes wakes to starting urinary stream, this appears to be improved since completing treatment as well. He does notice hot flashes related Lupron injections no other complaints or verbalized.                         ALLERGIES:  has No Known Allergies.  Meds: Current Outpatient Prescriptions  Medication Sig Dispense Refill  . albuterol (PROVENTIL HFA;VENTOLIN HFA) 108 (90 BASE) MCG/ACT inhaler Inhale 2 puffs into the lungs every 4 (four) hours as needed for wheezing. 1 Inhaler 1  . albuterol (PROVENTIL) (2.5 MG/3ML)  0.083% nebulizer solution Take 3 mLs (2.5 mg total) by nebulization every 6 (six) hours as needed for wheezing or shortness of breath. 25 mL 0  . amLODipine (NORVASC) 2.5 MG tablet Take 2.5 mg by mouth daily.  1  . clotrimazole-betamethasone (LOTRISONE) cream     . gabapentin (NEURONTIN) 300 MG capsule 300 mg 3 (three) times daily.     . hydrOXYzine (ATARAX/VISTARIL) 25 MG tablet Take 25 mg by mouth 3 (three) times daily.  0  . ipratropium-albuterol (DUONEB) 0.5-2.5 (3) MG/3ML SOLN Take 3 mLs by nebulization every 6 (six) hours as needed. 360 mL 0  . ketoconazole (NIZORAL) 200 MG tablet Take 200 mg by mouth daily.  0  . lisinopril-hydrochlorothiazide (PRINZIDE,ZESTORETIC) 10-12.5 MG per tablet Take 1 tablet by mouth daily.    . meloxicam (MOBIC) 15 MG tablet Take 15 mg by mouth daily.    . naproxen (NAPROSYN) 500 MG tablet     . nizatidine (AXID) 300 MG capsule Take 300 mg by mouth daily.  0  . selenium sulfide (SELSUN) 2.5 % shampoo Apply topically 2 (two) times a week. 120 mL 1  . tamsulosin (FLOMAX) 0.4 MG CAPS capsule Take 0.4 mg by mouth daily.  2  . chlorthalidone (HYGROTON) 25 MG tablet Take 0.5 tablets (12.5 mg total) by mouth daily. 30 tablet 1   No current facility-administered medications for this encounter.    Physical Findings:  weight is 171 lb  4.8 oz (77.701 kg). His blood pressure is 160/76 and his pulse is 74. His respiration is 16 and oxygen saturation is 100%.   Pain scale 0/10 In general this is a well appearing African-American male in no acute distress. He's alert and oriented x4 and appropriate throughout the examination. Cardiopulmonary assessment is negative for acute distress and he exhibits normal effort. I've offered to examine his perineum though he declines.   Lab Findings: Lab Results  Component Value Date   WBC 7.5 12/29/2011   HGB 14.9 12/29/2011   HCT 44.5 12/29/2011   PLT 286 12/29/2011    Lab Results  Component Value Date   NA 138 12/29/2011   K  4.1 12/29/2011   CO2 30 12/29/2011   GLUCOSE 109* 12/29/2011   BUN 19 12/29/2011   CREATININE 1.00 12/29/2011   CALCIUM 9.8 12/29/2011    Radiographic Findings: No results found.  Impression:   1. 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 The patient appears to be doing well following radiotherapy treatment. He will be following up with Dr. Tresa Moore or one of his partners in about 2 months time. He understands the rationale for closely following his PSA but also understands that this would be a more reliable reading following completion of androgen deprivation therapy. We discussed that we would be happy to see him back in the future should he have any need for radiotherapy treatment, however if he will be followed closely by urology, we will release him from additional follow-up. He states agreement and understanding of this plan.    Carola Rhine, PAC

## 2015-06-19 NOTE — Progress Notes (Addendum)
Weight and vitals stable. Denies pain. Denies dysuria or hematuria. Describes a weak stream with intermittent difficulty emptying. Reports nocturia x 2-4. Denies incontinence or leakage. Reports urinary urgency. Reports occasional diarrhea. Reports mild fatigue. Scheduled to follow up with [facility] on 09/02/2015.  BP 160/76 mmHg  Pulse 74  Resp 16  Wt 171 lb 4.8 oz (77.701 kg)  SpO2 100% Wt Readings from Last 3 Encounters:  06/19/15 171 lb 4.8 oz (77.701 kg)  05/15/15 166 lb 11.2 oz (75.615 kg)  05/09/15 167 lb 6.4 oz (75.932 kg)

## 2015-06-19 NOTE — Progress Notes (Signed)
Oncology Nurse Navigator Documentation  Oncology Nurse Navigator Flowsheets 04/23/2015 05/27/2015 06/19/2015  Navigator Encounter Type Treatment Telephone Follow-up Appt  Telephone - Incoming Call -  Treatment Phase Treatment - -  Barriers/Navigation Needs No barriers at this time (No Data) Education- Anthony Duffy states he is doing well. He has nocturia 2-4 times and his stream is weak. He is concerned because he has been having sweats and he is not sure if it is due to his medication. He is on androgen deprivation therapy (ADT) and we discuss the side effects. He states he did not realize he could have hot flashes.He also states he has  been fatigued and he has never felt tired in his life before now.We discussed that the radiation can make him fatigued as well as the ADT but hopefully his energy will improve since he has completed the radiation. He will follow up with [facility] 09/02/15. I asked him to call me with questions and concerns. He voiced understanding.   Education - - Pain/ Symptom Management  Interventions None required Coordination of Care Education Method  Coordination of Care - Appts -  Education Method - - Verbal  Support Groups/Services Friends and Family - Friends and Family  Acuity Level 1 Level 1 Level 1  Acuity Level 1 Minimal follow up required Minimal follow up required Minimal follow up required  Time Spent with Patient 15 15 30

## 2015-10-28 ENCOUNTER — Emergency Department (HOSPITAL_COMMUNITY)
Admission: EM | Admit: 2015-10-28 | Discharge: 2015-10-28 | Disposition: A | Discharge status: Home / Self Care | Payer: Medicare Other | Attending: Emergency Medicine | Admitting: Emergency Medicine

## 2015-10-28 ENCOUNTER — Ambulatory Visit (HOSPITAL_COMMUNITY)
Admission: EM | Admit: 2015-10-28 | Discharge: 2015-10-28 | Disposition: A | Discharge status: Home / Self Care | Payer: Medicare Other | Attending: Family Medicine | Admitting: Family Medicine

## 2015-10-28 ENCOUNTER — Encounter (HOSPITAL_COMMUNITY): Payer: Self-pay | Admitting: Emergency Medicine

## 2015-10-28 ENCOUNTER — Encounter (HOSPITAL_COMMUNITY): Payer: Self-pay

## 2015-10-28 ENCOUNTER — Ambulatory Visit (INDEPENDENT_AMBULATORY_CARE_PROVIDER_SITE_OTHER): Payer: Medicare Other

## 2015-10-28 DIAGNOSIS — I10 Essential (primary) hypertension: Secondary | ICD-10-CM | POA: Diagnosis not present

## 2015-10-28 DIAGNOSIS — Z87891 Personal history of nicotine dependence: Secondary | ICD-10-CM | POA: Diagnosis not present

## 2015-10-28 DIAGNOSIS — Z8546 Personal history of malignant neoplasm of prostate: Secondary | ICD-10-CM | POA: Diagnosis not present

## 2015-10-28 DIAGNOSIS — R0602 Shortness of breath: Secondary | ICD-10-CM

## 2015-10-28 DIAGNOSIS — J069 Acute upper respiratory infection, unspecified: Secondary | ICD-10-CM

## 2015-10-28 DIAGNOSIS — J40 Bronchitis, not specified as acute or chronic: Secondary | ICD-10-CM | POA: Insufficient documentation

## 2015-10-28 LAB — BASIC METABOLIC PANEL
Anion gap: 8 (ref 5–15)
BUN: 15 mg/dL (ref 6–20)
CHLORIDE: 100 mmol/L — AB (ref 101–111)
CO2: 30 mmol/L (ref 22–32)
Calcium: 9.9 mg/dL (ref 8.9–10.3)
Creatinine, Ser: 0.82 mg/dL (ref 0.61–1.24)
GFR calc Af Amer: >60 mL/min (ref >60)
GFR calc non Af Amer: >60 mL/min (ref >60)
GLUCOSE: 88 mg/dL (ref 65–99)
POTASSIUM: 3.8 mmol/L (ref 3.5–5.1)
Sodium: 138 mmol/L (ref 135–145)

## 2015-10-28 LAB — CBC
HEMATOCRIT: 41.1 % (ref 39.0–52.0)
Hemoglobin: 13 g/dL (ref 13.0–17.0)
MCH: 26.7 pg (ref 26.0–34.0)
MCHC: 31.6 g/dL (ref 30.0–36.0)
MCV: 84.6 fL (ref 78.0–100.0)
Platelets: 311 10*3/uL (ref 150–400)
RBC: 4.86 MIL/uL (ref 4.22–5.81)
RDW: 15.1 % (ref 11.5–15.5)
WBC: 5.2 10*3/uL (ref 4.0–10.5)

## 2015-10-28 LAB — I-STAT TROPONIN, ED: Troponin i, poc: 0 ng/mL (ref 0.00–0.08)

## 2015-10-28 MED ORDER — ALBUTEROL SULFATE (2.5 MG/3ML) 0.083% IN NEBU: 5.0000 mg | INHALATION_SOLUTION | Freq: Once | RESPIRATORY_TRACT | Status: DC

## 2015-10-28 MED ORDER — ALBUTEROL SULFATE (2.5 MG/3ML) 0.083% IN NEBU: 2.5000 mg | INHALATION_SOLUTION | RESPIRATORY_TRACT | 0 refills | Status: DC | PRN

## 2015-10-28 MED ORDER — IPRATROPIUM BROMIDE 0.02 % IN SOLN
RESPIRATORY_TRACT | Status: AC
  Filled 2015-10-28: qty 2.5

## 2015-10-28 MED ORDER — HYDROCODONE-HOMATROPINE 5-1.5 MG/5ML PO SYRP: 5.0000 mL | ORAL_SOLUTION | ORAL | 0 refills | Status: DC | PRN

## 2015-10-28 MED ORDER — IPRATROPIUM-ALBUTEROL 0.5-2.5 (3) MG/3ML IN SOLN
3.0000 mL | Freq: Once | RESPIRATORY_TRACT | Status: AC
Start: 2015-10-28 — End: 2015-10-28
  Administered 2015-10-28: 3 mL via RESPIRATORY_TRACT
  Filled 2015-10-28: qty 3

## 2015-10-28 MED ORDER — PREDNISONE 20 MG PO TABS: ORAL_TABLET | ORAL | 0 refills | Status: DC

## 2015-10-28 MED ORDER — IPRATROPIUM BROMIDE 0.02 % IN SOLN
0.5000 mg | Freq: Once | RESPIRATORY_TRACT | Status: AC
  Administered 2015-10-28: 0.5 mg via RESPIRATORY_TRACT

## 2015-10-28 MED ORDER — ALBUTEROL SULFATE (2.5 MG/3ML) 0.083% IN NEBU
5.0000 mg | INHALATION_SOLUTION | Freq: Once | RESPIRATORY_TRACT | Status: AC
  Administered 2015-10-28: 5 mg via RESPIRATORY_TRACT

## 2015-10-28 MED ORDER — ALBUTEROL SULFATE (2.5 MG/3ML) 0.083% IN NEBU
INHALATION_SOLUTION | RESPIRATORY_TRACT | Status: AC
  Filled 2015-10-28: qty 6

## 2015-10-28 MED ORDER — IPRATROPIUM BROMIDE 0.02 % IN SOLN: 0.5000 mg | RESPIRATORY_TRACT | 12 refills | Status: DC | PRN

## 2015-10-28 NOTE — ED Notes (Signed)
Taken to [facility] by UC shuttle.

## 2015-10-28 NOTE — ED Notes (Signed)
Janett Billow, RN ED triage accepts report

## 2015-10-28 NOTE — Discharge Instructions (Signed)
Use your nebulizer machine with albuterol and Atrovent every 4-6 hours for the next 3 days. Then he may use it as needed.

## 2015-10-28 NOTE — ED Triage Notes (Signed)
Pt reports shortness of breath for approximately 1 week. No resp distress noted. Pt reports productive cough with phlegm. He also reports mild chest pain, worse with deep breathing.

## 2015-10-28 NOTE — ED Provider Notes (Signed)
[place]    CSN: [id number] Arrival date & time: 10/28/15  1137  First Provider Contact:  First MD Initiated Contact with Patient 10/28/15 1326        History   Chief Complaint Chief Complaint  Patient presents with  . Shortness of Breath    HPI Anthony Duffy is a 75 y.o. male.    Shortness of Breath  Severity:  Moderate Onset quality:  Sudden Duration:  1 week Progression:  Worsening Chronicity:  New Context comment:  No chest pain , diaphoresis assoc with sob at night. Relieved by:  None tried Ineffective treatments:  None tried Associated symptoms: diaphoresis, fever and wheezing   Associated symptoms: no chest pain     Past Medical History:  Diagnosis Date  . Arthritis   . Asthma    as child  . Bronchitis   . Hypertension   . Prostate cancer ([place]) 10/14/2014    Patient Active Problem List   Diagnosis Date Noted  . Malignant neoplasm of prostate ([place]) 12/03/2014    Past Surgical History:  Procedure Laterality Date  . CARDIAC SURGERY    . CATARACT EXTRACTION W/PHACO  01/05/2012   Procedure: CATARACT EXTRACTION PHACO AND INTRAOCULAR LENS PLACEMENT (IOC);  Surgeon: Adonis Brook, MD;  Location: [place];  Service: Ophthalmology;  Laterality: Right;  . FINGER SURGERY     s/p injury 30 years ago  . PROSTATE BIOPSY  10/14/2014       Home Medications    Prior to Admission medications   Medication Sig Start Date End Date Taking? Authorizing Provider  albuterol (PROVENTIL HFA;VENTOLIN HFA) 108 (90 BASE) MCG/ACT inhaler Inhale 2 puffs into the lungs every 4 (four) hours as needed for wheezing. 12/29/11   Janne Napoleon, NP  albuterol (PROVENTIL) (2.5 MG/3ML) 0.083% nebulizer solution Take 3 mLs (2.5 mg total) by nebulization every 6 (six) hours as needed for wheezing or shortness of breath. 06/21/13   Janne Napoleon, NP  amLODipine (NORVASC) 2.5 MG tablet Take 2.5 mg by mouth daily. 06/02/15   Historical Provider, MD  chlorthalidone (HYGROTON) 25 MG tablet  Take 0.5 tablets (12.5 mg total) by mouth daily. 11/22/11 03/24/15  Nita Sells, MD  clotrimazole-betamethasone (LOTRISONE) cream  04/22/15   Historical Provider, MD  gabapentin (NEURONTIN) 300 MG capsule 300 mg 3 (three) times daily.  04/22/15   Historical Provider, MD  hydrOXYzine (ATARAX/VISTARIL) 25 MG tablet Take 25 mg by mouth 3 (three) times daily. 06/02/15   Historical Provider, MD  ipratropium-albuterol (DUONEB) 0.5-2.5 (3) MG/3ML SOLN Take 3 mLs by nebulization every 6 (six) hours as needed. 02/12/12   Martha Clan, MD  ketoconazole (NIZORAL) 200 MG tablet Take 200 mg by mouth daily. 04/09/15   Historical Provider, MD  lisinopril-hydrochlorothiazide (PRINZIDE,ZESTORETIC) 10-12.5 MG per tablet Take 1 tablet by mouth daily.    Historical Provider, MD  meloxicam (MOBIC) 15 MG tablet Take 15 mg by mouth daily.    Historical Provider, MD  naproxen (NAPROSYN) 500 MG tablet  03/15/15   Historical Provider, MD  nizatidine (AXID) 300 MG capsule Take 300 mg by mouth daily. 01/09/15   Historical Provider, MD  selenium sulfide (SELSUN) 2.5 % shampoo Apply topically 2 (two) times a week. 12/29/11   Janne Napoleon, NP  tamsulosin (FLOMAX) 0.4 MG CAPS capsule Take 0.4 mg by mouth daily. 05/27/15   Historical Provider, MD    Family History Family History  Problem Relation Age of Onset  . Cancer Mother     unknown  .  Cancer Cousin     unknown    Social History Social History  Substance Use Topics  . Smoking status: Former Smoker    Packs/day: 0.50    Years: 10.00    Quit date: 12/03/1994  . Smokeless tobacco: Never Used     Comment: stopped 15 years ago  . Alcohol use No     Comment: stopped 15 years ago     Allergies   Review of patient's allergies indicates no known allergies.   Review of Systems Review of Systems  Constitutional: Positive for diaphoresis and fever.  HENT: Negative.   Respiratory: Positive for chest tightness, shortness of breath and wheezing.   Cardiovascular:  Negative for chest pain, palpitations and leg swelling.  Gastrointestinal: Negative.      Physical Exam Triage Vital Signs ED Triage Vitals  Enc Vitals Group     BP 10/28/15 1250 133/80     Pulse Rate 10/28/15 1250 85     Resp 10/28/15 1250 20     Temp 10/28/15 1250 98 F (36.7 C)     Temp Source 10/28/15 1250 Oral     SpO2 10/28/15 1250 96 %     Weight 10/28/15 1250 160 lb (72.6 kg)     Height 10/28/15 1250 6' (1.829 m)     Head Circumference --      Peak Flow --      Pain Score 10/28/15 1301 3     Pain Loc --      Pain Edu? --      Excl. in [place]? --    No data found.   Updated Vital Signs BP 133/80 (BP Location: Left Arm)   Pulse 85   Temp 98 F (36.7 C) (Oral)   Resp 20   Ht 6' (1.829 m)   Wt 160 lb (72.6 kg)   SpO2 96%   BMI 21.70 kg/m   Visual Acuity Right Eye Distance:   Left Eye Distance:   Bilateral Distance:    Right Eye Near:   Left Eye Near:    Bilateral Near:     Physical Exam  Constitutional: He appears well-developed and well-nourished. No distress.  Eyes: EOM are normal. Pupils are equal, round, and reactive to light.  Neck: Normal range of motion. Neck supple.  Cardiovascular: Normal rate, regular rhythm, normal heart sounds and intact distal pulses.   Pulmonary/Chest: He has wheezes. He has no rales. He exhibits no tenderness.  Abdominal: Soft. There is no tenderness.  Skin: Skin is warm and dry.  Nursing note and vitals reviewed.    UC Treatments / Results  Labs (all labs ordered are listed, but only abnormal results are displayed) Labs Reviewed - No data to display  EKG  EKG Interpretation None       Radiology Dg Chest 2 View  Result Date: 10/28/2015 CLINICAL DATA:  Shortness of breath, chest congestion, cough and fever EXAM: CHEST  2 VIEW COMPARISON:  Chest x-ray of 11/04/2014 FINDINGS: No active infiltrate or effusion is seen. There is some peribronchial thickening which may indicate bronchitis. Mediastinal and hilar  contours otherwise are unremarkable. The heart is within normal limits in size. No bony abnormality is seen. 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 IMPRESSION: No pneumonia or effusion.  Question bronchitis. Electronically Signed   By: Ivar Drape M.D.   On: 10/28/2015 14:30  X-rays reviewed and report per radiologist.  Procedures Procedures (including critical care time)  Medications Ordered in UC Medications  albuterol (PROVENTIL) (2.5 MG/3ML) 0.083% nebulizer solution 5 mg (5 mg Nebulization Given 10/28/15 1348)  ipratropium (ATROVENT) nebulizer solution 0.5 mg (0.5 mg Nebulization Given 10/28/15 1348)     Initial Impression / Assessment and Plan / UC Course  I have reviewed the triage vital signs and the nursing notes.  Pertinent labs & imaging results that were available during my care of the patient were reviewed by me and considered in my medical decision making (see chart for details).  Clinical Course  Comment By Time  Sent to ER for eval of sob and persistent wheezing with new RBBB on ecg. Billy Fischer, MD 07/25 1500   Min improvement with neb treatment   Final Clinical Impressions(s) / UC Diagnoses   Final diagnoses:  Shortness of breath at rest    New Prescriptions Discharge Medication List as of 10/28/2015  3:08 PM       Billy Fischer, MD 10/28/15 [phone]

## 2015-10-28 NOTE — ED Triage Notes (Addendum)
PT reports exertional SOB. PT reports when he gets short of breath he breaks into a sweat and feels as though he might faint. PT has history of asthma. PT denies chest pain. PT reports his breathing feels very tight. PT reports condition has worsened over the last week. PT able to speak in full sentences.

## 2015-10-28 NOTE — ED Notes (Signed)
Pt verbalized understanding of d/c instructions and has no further questions. Pt stable and NAD. Airway in tact.

## 2015-10-28 NOTE — ED Provider Notes (Signed)
[place] DEPT Provider Note   CSN: [id number] Arrival date & time: 10/28/15  1510  First Provider Contact:  None       History   Chief Complaint Chief Complaint  Patient presents with  . Shortness of Breath    HPI Anthony Duffy is a 75 y.o. male.  HPI Patient reports he's had a cough for between one to 2 weeks. Cough has been productive of clear mucus. He reports he's also had a lot of nasal congestion and stuffiness. Patient states he's had some chills and thought maybe he had a fever but has not documented any. He reports he has a little discomfort in the center of his chest as been constant and is worse by coughing. He reports she's felt short of breath with exertion. Even minor exercise makes him feel fairly winded. Patient does have a nebulizer machine at home. He reports he doesn't use it very often but uses several times yesterday and didn't feel that it helped much. Patient has a distant smoking history, he quit 20 years ago. Last time he had similar symptoms was last year when he was diagnosed with bronchitis and a bad cold. She denies any lower extremity pain or swelling. Past Medical History:  Diagnosis Date  . Arthritis   . Asthma    as child  . Bronchitis   . Hypertension   . Prostate cancer ([place]) 10/14/2014    Patient Active Problem List   Diagnosis Date Noted  . Malignant neoplasm of prostate ([place]) 12/03/2014    Past Surgical History:  Procedure Laterality Date  . CARDIAC SURGERY    . CATARACT EXTRACTION W/PHACO  01/05/2012   Procedure: CATARACT EXTRACTION PHACO AND INTRAOCULAR LENS PLACEMENT (IOC);  Surgeon: Adonis Brook, MD;  Location: [place];  Service: Ophthalmology;  Laterality: Right;  . FINGER SURGERY     s/p injury 30 years ago  . PROSTATE BIOPSY  10/14/2014       Home Medications    Prior to Admission medications   Medication Sig Start Date End Date Taking? Authorizing Provider  albuterol (PROVENTIL HFA;VENTOLIN HFA) 108 (90 BASE) MCG/ACT  inhaler Inhale 2 puffs into the lungs every 4 (four) hours as needed for wheezing. 12/29/11   Janne Napoleon, NP  albuterol (PROVENTIL) (2.5 MG/3ML) 0.083% nebulizer solution Take 3 mLs (2.5 mg total) by nebulization every 6 (six) hours as needed for wheezing or shortness of breath. 06/21/13   Janne Napoleon, NP  albuterol (PROVENTIL) (2.5 MG/3ML) 0.083% nebulizer solution Take 3 mLs (2.5 mg total) by nebulization every 4 (four) hours as needed for wheezing or shortness of breath. 10/28/15   Charlesetta Shanks, MD  amLODipine (NORVASC) 2.5 MG tablet Take 2.5 mg by mouth daily. 06/02/15   Historical Provider, MD  chlorthalidone (HYGROTON) 25 MG tablet Take 0.5 tablets (12.5 mg total) by mouth daily. 11/22/11 03/24/15  Nita Sells, MD  clotrimazole-betamethasone (LOTRISONE) cream  04/22/15   Historical Provider, MD  gabapentin (NEURONTIN) 300 MG capsule 300 mg 3 (three) times daily.  04/22/15   Historical Provider, MD  HYDROcodone-homatropine (HYCODAN) 5-1.5 MG/5ML syrup Take 5 mLs by mouth every 4 (four) hours as needed for cough. 10/28/15   Charlesetta Shanks, MD  hydrOXYzine (ATARAX/VISTARIL) 25 MG tablet Take 25 mg by mouth 3 (three) times daily. 06/02/15   Historical Provider, MD  ipratropium (ATROVENT) 0.02 % nebulizer solution Take 2.5 mLs (0.5 mg total) by nebulization every 4 (four) hours as needed for wheezing or shortness of breath (Use with albuterol  every 4-6 hours, use with ipratropium as prescribed.). 10/28/15   Charlesetta Shanks, MD  ipratropium-albuterol (DUONEB) 0.5-2.5 (3) MG/3ML SOLN Take 3 mLs by nebulization every 6 (six) hours as needed. 02/12/12   Martha Clan, MD  ketoconazole (NIZORAL) 200 MG tablet Take 200 mg by mouth daily. 04/09/15   Historical Provider, MD  lisinopril-hydrochlorothiazide (PRINZIDE,ZESTORETIC) 10-12.5 MG per tablet Take 1 tablet by mouth daily.    Historical Provider, MD  meloxicam (MOBIC) 15 MG tablet Take 15 mg by mouth daily.    Historical Provider, MD  naproxen (NAPROSYN) 500  MG tablet  03/15/15   Historical Provider, MD  nizatidine (AXID) 300 MG capsule Take 300 mg by mouth daily. 01/09/15   Historical Provider, MD  predniSONE (DELTASONE) 20 MG tablet 3 tabs po day one, then 2 po daily x 4 days 10/28/15   Charlesetta Shanks, MD  selenium sulfide (SELSUN) 2.5 % shampoo Apply topically 2 (two) times a week. 12/29/11   Janne Napoleon, NP  tamsulosin (FLOMAX) 0.4 MG CAPS capsule Take 0.4 mg by mouth daily. 05/27/15   Historical Provider, MD    Family History Family History  Problem Relation Age of Onset  . Cancer Mother     unknown  . Cancer Cousin     unknown    Social History Social History  Substance Use Topics  . Smoking status: Former Smoker    Packs/day: 0.50    Years: 10.00    Quit date: 12/03/1994  . Smokeless tobacco: Never Used     Comment: stopped 15 years ago  . Alcohol use No     Comment: stopped 15 years ago     Allergies   Review of patient's allergies indicates no known allergies.   Review of Systems Review of Systems 10 Systems reviewed and are negative for acute change except as noted in the HPI.   Physical Exam Updated Vital Signs BP 147/85   Pulse 71   Temp 97.4 F (36.3 C) (Oral)   Resp 19   SpO2 95%   Physical Exam  Constitutional: He appears well-developed and well-nourished. No distress.  HENT:  Head: Normocephalic and atraumatic.  Nose: Nose normal.  Mouth/Throat: Oropharynx is clear and moist. No oropharyngeal exudate.  Eyes: EOM are normal. Pupils are equal, round, and reactive to light.  Neck: Neck supple.  Cardiovascular: Normal rate, regular rhythm, normal heart sounds and intact distal pulses.   Pulmonary/Chest: Effort normal. He has wheezes.  Patient has expiratory wheezes bilateral bases. Occasional cough paroxysmal. No respiratory distress.  Abdominal: Soft. Bowel sounds are normal. He exhibits no distension. There is no tenderness. There is no guarding.  Musculoskeletal: Normal range of motion. He exhibits no  edema or tenderness.  Neurological: He is alert. He exhibits normal muscle tone. Coordination normal.  Skin: Skin is warm and dry.  Psychiatric: He has a normal mood and affect.     ED Treatments / Results  Labs (all labs ordered are listed, but only abnormal results are displayed) Labs Reviewed  BASIC METABOLIC PANEL - Abnormal; Notable for the following:       Result Value   Chloride 100 (*)    All other components within normal limits  CBC  I-STAT TROPOININ, ED    EKG  EKG Interpretation  Date/Time:  Tuesday October 28 2015 15:15:24 EDT Ventricular Rate:  75 PR Interval:    QRS Duration: 130 QT Interval:  420 QTC Calculation: 469 R Axis:   62 Text Interpretation:  Right bundle branch  block Abnormal ECG agree. new RBBB since 2013 SR with artifact Reconfirmed by Johnney Killian, MD, Jeannie Done [phone]) on 10/28/2015 5:30:22 PM       Radiology Dg Chest 2 View  Result Date: 10/28/2015 CLINICAL DATA:  Shortness of breath, chest congestion, cough and fever EXAM: CHEST  2 VIEW COMPARISON:  Chest x-ray of 11/04/2014 FINDINGS: No active infiltrate or effusion is seen. There is some peribronchial thickening which may indicate bronchitis. Mediastinal and hilar contours otherwise are unremarkable. The heart is within normal limits in size. No bony abnormality is seen. 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 IMPRESSION: No pneumonia or effusion.  Question bronchitis. Electronically Signed   By: Ivar Drape M.D.   On: 10/28/2015 14:30   Procedures Procedures (including critical care time)  Medications Ordered in ED Medications  ipratropium-albuterol (DUONEB) 0.5-2.5 (3) MG/3ML nebulizer solution 3 mL (3 mLs Nebulization Given 10/28/15 1724)     Initial Impression / Assessment and Plan / ED Course  I have reviewed the triage vital signs and the nursing notes.  Pertinent labs & imaging results that were available during my care of the patient were reviewed by me and  considered in my medical decision making (see chart for details).  Clinical Course     Final Clinical Impressions(s) / ED Diagnoses   Final diagnoses:  Bronchitis  URI (upper respiratory infection)  Patient sent from urgent care for further evaluation. Urgent care concern for right bundle branch block new compared to old. No acute ischemic changes identified. Patient's troponin is negative. History and physical examination are consistent with URI and bronchitis with wheezing. History of present illness does not suggest cardiac ischemic disease. I suspect right bundle branch  is not acutely associated with the patient's presenting symptoms. He will be treated for bronchitis and URI with planned follow-up with PCP.  New Prescriptions New Prescriptions   ALBUTEROL (PROVENTIL) (2.5 MG/3ML) 0.083% NEBULIZER SOLUTION    Take 3 mLs (2.5 mg total) by nebulization every 4 (four) hours as needed for wheezing or shortness of breath.   HYDROCODONE-HOMATROPINE (HYCODAN) 5-1.5 MG/5ML SYRUP    Take 5 mLs by mouth every 4 (four) hours as needed for cough.   IPRATROPIUM (ATROVENT) 0.02 % NEBULIZER SOLUTION    Take 2.5 mLs (0.5 mg total) by nebulization every 4 (four) hours as needed for wheezing or shortness of breath (Use with albuterol every 4-6 hours, use with ipratropium as prescribed.).   PREDNISONE (DELTASONE) 20 MG TABLET    3 tabs po day one, then 2 po daily x 4 days     Charlesetta Shanks, MD 10/28/15 1733

## 2015-11-20 ENCOUNTER — Ambulatory Visit (HOSPITAL_COMMUNITY)
Admission: EM | Admit: 2015-11-20 | Discharge: 2015-11-20 | Disposition: A | Discharge status: Home / Self Care | Payer: Medicare Other | Attending: Family | Admitting: Family

## 2015-11-20 ENCOUNTER — Encounter (HOSPITAL_COMMUNITY): Payer: Self-pay | Admitting: Emergency Medicine

## 2015-11-20 DIAGNOSIS — R1084 Generalized abdominal pain: Secondary | ICD-10-CM | POA: Diagnosis not present

## 2015-11-20 DIAGNOSIS — K5901 Slow transit constipation: Secondary | ICD-10-CM

## 2015-11-20 MED ORDER — SENNOSIDES-DOCUSATE SODIUM 8.6-50 MG PO TABS: 2.0000 | ORAL_TABLET | Freq: Two times a day (BID) | ORAL | 0 refills | Status: DC | PRN

## 2015-11-20 NOTE — Discharge Instructions (Signed)
Recommend take Senokot pills as directed until bowel movements occur and become more regular. Encouraged to increase fluid intake and go to ER if bowel movements and abdominal pain do not improve within the next 2 to 3 days.

## 2015-11-20 NOTE — ED Triage Notes (Signed)
Pt c/o abd pain onset x1 weeks associated w/frequent belching, constipation... Also reports pain will radiate to chest  Also reports hx of prostate cancer  Reports LBM was about an hour ago but he was straining  Denies fevers, v/n/d  A&O x4... NAD... Wife at bedside.

## 2015-11-20 NOTE — ED Provider Notes (Signed)
CSN: [id number]     Arrival date & time 11/20/15  1155 History   First MD Initiated Contact with Patient 11/20/15 1319     Chief Complaint  Patient presents with  . Abdominal Pain   (Consider location/radiation/quality/duration/timing/severity/associated sxs/prior Treatment) 75 year old male is accompanied by his wife with concern over generalized abdominal pain and constipation for the past week. He usually has a bowel movement daily but now his bowel movements have been every 2 to 3 days. His wife gave him Miralax this morning and he did have a small bowel movement but it was hard and only a small amount of stool came out. He denies any nausea, vomiting or fever. He requests a Rx for another type of laxative.    The history is provided by the patient and the spouse.    Past Medical History:  Diagnosis Date  . Arthritis   . Asthma    as child  . Bronchitis   . Hypertension   . Prostate cancer ([place]) 10/14/2014   Past Surgical History:  Procedure Laterality Date  . CARDIAC SURGERY    . CATARACT EXTRACTION W/PHACO  01/05/2012   Procedure: CATARACT EXTRACTION PHACO AND INTRAOCULAR LENS PLACEMENT (IOC);  Surgeon: Adonis Brook, MD;  Location: [facility];  Service: Ophthalmology;  Laterality: Right;  . FINGER SURGERY     s/p injury 30 years ago  . PROSTATE BIOPSY  10/14/2014   Family History  Problem Relation Age of Onset  . Cancer Mother     unknown  . Cancer Cousin     unknown   Social History  Substance Use Topics  . Smoking status: Former Smoker    Packs/day: 0.50    Years: 10.00    Quit date: 12/03/1994  . Smokeless tobacco: Never Used     Comment: stopped 15 years ago  . Alcohol use No     Comment: stopped 15 years ago    Review of Systems  Constitutional: Negative for chills, fatigue and fever.  HENT: Negative for sore throat.   Respiratory: Negative for cough.   Cardiovascular: Negative for chest pain.  Gastrointestinal: Positive for abdominal pain and constipation.  Negative for blood in stool, diarrhea, nausea and vomiting.  Genitourinary: Negative for dysuria and hematuria.  Skin: Negative for rash.  Neurological: Negative for headaches.    Allergies  Review of patient's allergies indicates no known allergies.  Home Medications   Prior to Admission medications   Medication Sig Start Date End Date Taking? Authorizing Provider  albuterol (PROVENTIL HFA;VENTOLIN HFA) 108 (90 BASE) MCG/ACT inhaler Inhale 2 puffs into the lungs every 4 (four) hours as needed for wheezing. 12/29/11  Yes Janne Napoleon, NP  amLODipine (NORVASC) 2.5 MG tablet Take 2.5 mg by mouth daily. 06/02/15  Yes Historical Provider, MD  clotrimazole-betamethasone (LOTRISONE) cream  04/22/15  Yes Historical Provider, MD  gabapentin (NEURONTIN) 300 MG capsule 300 mg 3 (three) times daily.  04/22/15  Yes Historical Provider, MD  HYDROcodone-homatropine (HYCODAN) 5-1.5 MG/5ML syrup Take 5 mLs by mouth every 4 (four) hours as needed for cough. 10/28/15  Yes Charlesetta Shanks, MD  ipratropium (ATROVENT) 0.02 % nebulizer solution Take 2.5 mLs (0.5 mg total) by nebulization every 4 (four) hours as needed for wheezing or shortness of breath (Use with albuterol every 4-6 hours, use with ipratropium as prescribed.). 10/28/15  Yes Charlesetta Shanks, MD  lisinopril-hydrochlorothiazide (PRINZIDE,ZESTORETIC) 10-12.5 MG per tablet Take 1 tablet by mouth daily.   Yes Historical Provider, MD  meloxicam [facility])  15 MG tablet Take 15 mg by mouth daily.   Yes Historical Provider, MD  naproxen (NAPROSYN) 500 MG tablet  03/15/15  Yes Historical Provider, MD  albuterol (PROVENTIL) (2.5 MG/3ML) 0.083% nebulizer solution Take 3 mLs (2.5 mg total) by nebulization every 6 (six) hours as needed for wheezing or shortness of breath. 06/21/13   Janne Napoleon, NP  albuterol (PROVENTIL) (2.5 MG/3ML) 0.083% nebulizer solution Take 3 mLs (2.5 mg total) by nebulization every 4 (four) hours as needed for wheezing or shortness of breath. 10/28/15    Charlesetta Shanks, MD  chlorthalidone (HYGROTON) 25 MG tablet Take 0.5 tablets (12.5 mg total) by mouth daily. 11/22/11 03/24/15  Nita Sells, MD  hydrOXYzine (ATARAX/VISTARIL) 25 MG tablet Take 25 mg by mouth 3 (three) times daily. 06/02/15   Historical Provider, MD  ipratropium-albuterol (DUONEB) 0.5-2.5 (3) MG/3ML SOLN Take 3 mLs by nebulization every 6 (six) hours as needed. 02/12/12   Martha Clan, MD  ketoconazole (NIZORAL) 200 MG tablet Take 200 mg by mouth daily. 04/09/15   Historical Provider, MD  nizatidine (AXID) 300 MG capsule Take 300 mg by mouth daily. 01/09/15   Historical Provider, MD  predniSONE (DELTASONE) 20 MG tablet 3 tabs po day one, then 2 po daily x 4 days 10/28/15   Charlesetta Shanks, MD  selenium sulfide (SELSUN) 2.5 % shampoo Apply topically 2 (two) times a week. 12/29/11   Janne Napoleon, NP  senna-docusate (SENOKOT-S) 8.6-50 MG tablet Take 2 tablets by mouth 2 (two) times daily as needed for moderate constipation (continue until regular bowel movements occur). 11/20/15   Katy Apo, NP  tamsulosin (FLOMAX) 0.4 MG CAPS capsule Take 0.4 mg by mouth daily. 05/27/15   Historical Provider, MD   Meds Ordered and Administered this Visit  Medications - No data to display  BP 150/82 (BP Location: Right Arm)   Pulse 66   Temp 98 F (36.7 C) (Oral)   Resp 18   SpO2 99%  No data found.   Physical Exam  Constitutional: He is oriented to person, place, and time. He appears well-developed and well-nourished. No distress.  HENT:  Head: Normocephalic and atraumatic.  Nose: Nose normal.  Mouth/Throat: Uvula is midline, oropharynx is clear and moist and mucous membranes are normal.  Neck: Normal range of motion. Neck supple.  Cardiovascular: Normal rate, regular rhythm and normal heart sounds.   Pulmonary/Chest: Effort normal. He has wheezes.  Abdominal: Soft. Normal appearance, normal aorta and bowel sounds are normal. He exhibits no mass. There is no hepatosplenomegaly. There is  generalized tenderness. There is no rigidity, no rebound, no guarding and no CVA tenderness.  Lymphadenopathy:    He has no cervical adenopathy.  Neurological: He is alert and oriented to person, place, and time.  Skin: Skin is warm and dry. Capillary refill takes less than 2 seconds.  Psychiatric: He has a normal mood and affect. His behavior is normal. Judgment and thought content normal.    Urgent Care Course   Clinical Course    Procedures (including critical care time)  Labs Review Labs Reviewed - No data to display  Imaging Review No results found.   Visual Acuity Review  Right Eye Distance:   Left Eye Distance:   Bilateral Distance:    Right Eye Near:   Left Eye Near:    Bilateral Near:         MDM   1. Slow transit constipation   2. Generalized abdominal pain    Abdominal pain most  likely due to constipation at this time. Recommend try Senokot 2 tablets to start and repeat every 8 to 12 hours as needed. Also reviewed that MiraLax will also work well but need to repeat amount and dosing multiple times a day to see good results. Encouraged to increase water intake and fiber. If pain continues and regular bowel movements do not occur, go to ER for further evaluation.       Katy Apo, NP 11/21/15 1106

## 2016-08-08 ENCOUNTER — Emergency Department (HOSPITAL_COMMUNITY): Payer: Medicare Other

## 2016-08-08 ENCOUNTER — Encounter (HOSPITAL_COMMUNITY): Payer: Self-pay | Admitting: Emergency Medicine

## 2016-08-08 ENCOUNTER — Emergency Department (HOSPITAL_COMMUNITY)
Admission: EM | Admit: 2016-08-08 | Discharge: 2016-08-08 | Disposition: A | Discharge status: Home / Self Care | Payer: Medicare Other | Attending: Emergency Medicine | Admitting: Emergency Medicine

## 2016-08-08 DIAGNOSIS — Z8546 Personal history of malignant neoplasm of prostate: Secondary | ICD-10-CM | POA: Insufficient documentation

## 2016-08-08 DIAGNOSIS — Z79899 Other long term (current) drug therapy: Secondary | ICD-10-CM | POA: Insufficient documentation

## 2016-08-08 DIAGNOSIS — R05 Cough: Secondary | ICD-10-CM | POA: Insufficient documentation

## 2016-08-08 DIAGNOSIS — Z87891 Personal history of nicotine dependence: Secondary | ICD-10-CM | POA: Diagnosis not present

## 2016-08-08 DIAGNOSIS — I10 Essential (primary) hypertension: Secondary | ICD-10-CM | POA: Insufficient documentation

## 2016-08-08 DIAGNOSIS — J45909 Unspecified asthma, uncomplicated: Secondary | ICD-10-CM | POA: Insufficient documentation

## 2016-08-08 DIAGNOSIS — R059 Cough, unspecified: Secondary | ICD-10-CM

## 2016-08-08 DIAGNOSIS — R0602 Shortness of breath: Secondary | ICD-10-CM | POA: Insufficient documentation

## 2016-08-08 LAB — BASIC METABOLIC PANEL
Anion gap: 6 (ref 5–15)
BUN: 14 mg/dL (ref 6–20)
CO2: 28 mmol/L (ref 22–32)
Calcium: 9 mg/dL (ref 8.9–10.3)
Chloride: 105 mmol/L (ref 101–111)
Creatinine, Ser: 0.84 mg/dL (ref 0.61–1.24)
GFR calc Af Amer: >60 mL/min (ref >60)
GFR calc non Af Amer: >60 mL/min (ref >60)
Glucose, Bld: 106 mg/dL — ABNORMAL HIGH (ref 65–99)
Potassium: 4 mmol/L (ref 3.5–5.1)
Sodium: 139 mmol/L (ref 135–145)

## 2016-08-08 LAB — CBC WITH DIFFERENTIAL/PLATELET
Basophils Absolute: 0 10*3/uL (ref 0.0–0.1)
Basophils Relative: 0 %
Eosinophils Absolute: 0.8 10*3/uL — ABNORMAL HIGH (ref 0.0–0.7)
Eosinophils Relative: 23 %
HCT: 35.4 % — ABNORMAL LOW (ref 39.0–52.0)
Hemoglobin: 11.5 g/dL — ABNORMAL LOW (ref 13.0–17.0)
Lymphocytes Relative: 21 %
Lymphs Abs: 0.7 10*3/uL (ref 0.7–4.0)
MCH: 27.4 pg (ref 26.0–34.0)
MCHC: 32.5 g/dL (ref 30.0–36.0)
MCV: 84.3 fL (ref 78.0–100.0)
Monocytes Absolute: 0.3 10*3/uL (ref 0.1–1.0)
Monocytes Relative: 8 %
Neutro Abs: 1.6 10*3/uL — ABNORMAL LOW (ref 1.7–7.7)
Neutrophils Relative %: 48 %
Platelets: 279 10*3/uL (ref 150–400)
RBC: 4.2 MIL/uL — ABNORMAL LOW (ref 4.22–5.81)
RDW: 14.8 % (ref 11.5–15.5)
WBC: 3.5 10*3/uL — ABNORMAL LOW (ref 4.0–10.5)

## 2016-08-08 LAB — I-STAT TROPONIN, ED: Troponin i, poc: 0 ng/mL (ref 0.00–0.08)

## 2016-08-08 MED ORDER — ALBUTEROL SULFATE (2.5 MG/3ML) 0.083% IN NEBU: 2.5000 mg | INHALATION_SOLUTION | Freq: Four times a day (QID) | RESPIRATORY_TRACT | 0 refills | Status: DC | PRN

## 2016-08-08 MED ORDER — PREDNISONE 20 MG PO TABS
60.0000 mg | ORAL_TABLET | Freq: Once | ORAL | Status: AC
  Administered 2016-08-08: 60 mg via ORAL
  Filled 2016-08-08: qty 3

## 2016-08-08 MED ORDER — IPRATROPIUM-ALBUTEROL 0.5-2.5 (3) MG/3ML IN SOLN
3.0000 mL | Freq: Once | RESPIRATORY_TRACT | Status: AC
  Administered 2016-08-08: 3 mL via RESPIRATORY_TRACT
  Filled 2016-08-08: qty 3

## 2016-08-08 MED ORDER — AZITHROMYCIN 250 MG PO TABS
500.0000 mg | ORAL_TABLET | Freq: Once | ORAL | Status: AC
  Administered 2016-08-08: 500 mg via ORAL
  Filled 2016-08-08: qty 2

## 2016-08-08 MED ORDER — AZITHROMYCIN 250 MG PO TABS: 250.0000 mg | ORAL_TABLET | Freq: Every day | ORAL | 0 refills | Status: DC

## 2016-08-08 MED ORDER — PREDNISONE 20 MG PO TABS: 60.0000 mg | ORAL_TABLET | Freq: Every day | ORAL | 0 refills | Status: DC

## 2016-08-08 NOTE — Discharge Instructions (Signed)
Medications: Prednisone, azithromycin  Treatment: Take prednisone and azithromycin as prescribed for 5 days. You have received your first dose in the emergency department. Use albuterol inhaler every 4-6 hours as needed for shortness of breath or wheezing.  Follow-up: Please follow-up with [facility] for further evaluation and treatment of your breathing problems.. Please return to the emergency department if you develop any new or worsening symptoms.

## 2016-08-08 NOTE — ED Provider Notes (Signed)
[facility] DEPT Provider Note   CSN: [id number] Arrival date & time: 08/08/16  0847     History   Chief Complaint Chief Complaint  Patient presents with  . Shortness of Breath    HPI Anthony Duffy is a 76 y.o. male with history of hypertension, bronchitis, prostate cancer who presents with a two-week history of productive cough and shortness of breath. Patient reports his shortness of breath is worse on exertion. He notes that shortness of breath is also worse with a failed going on him or certain smells or fumes. He has no orthopnea. He has been coughing up a yellow mucus. Patient has been using his albuterol nebulizer at home with some relief. Patient reports a slight chest pain with coughing, but no other significant pain. Patient has also had associated dizziness (unstable feeling) associated with the shortness of breath with walking. He denies any fevers. Patient has prostate cancer and received radiation around one year ago and has been stable since. He is not on chemotherapy. Patient denies any abdominal pain, nausea, vomiting, urinary symptoms. Patient denies any recent long trips, surgeries, new leg pain or swelling. Patient tries to be very active.  HPI  Past Medical History:  Diagnosis Date  . Arthritis   . Asthma    as child  . Bronchitis   . Hypertension   . Prostate cancer ([place]) 10/14/2014    Patient Active Problem List   Diagnosis Date Noted  . Malignant neoplasm of prostate ([place]) 12/03/2014    Past Surgical History:  Procedure Laterality Date  . CARDIAC SURGERY    . CATARACT EXTRACTION W/PHACO  01/05/2012   Procedure: CATARACT EXTRACTION PHACO AND INTRAOCULAR LENS PLACEMENT (IOC);  Surgeon: Adonis Brook, MD;  Location: [facility];  Service: Ophthalmology;  Laterality: Right;  . FINGER SURGERY     s/p injury 30 years ago  . PROSTATE BIOPSY  10/14/2014       Home Medications    Prior to Admission medications   Medication Sig Start Date End Date Taking?  Authorizing Provider  albuterol (PROVENTIL HFA;VENTOLIN HFA) 108 (90 BASE) MCG/ACT inhaler Inhale 2 puffs into the lungs every 4 (four) hours as needed for wheezing. 12/29/11  Yes Mabe, Shanon Brow, NP  amLODipine (NORVASC) 5 MG tablet Take 5 mg by mouth daily. 08/01/16  Yes [provider]  chlorthalidone (HYGROTON) 25 MG tablet Take 0.5 tablets (12.5 mg total) by mouth daily. 11/22/11 08/08/17 Yes Nita Sells, MD  Diphenhydramine-PE-APAP (THERAFLU EXPRESSMAX) 12.5-5-325 MG/15ML LIQD Take 15 mLs by mouth every 6 (six) hours as needed (cough).   Yes [provider]  gabapentin (NEURONTIN) 300 MG capsule 300 mg 3 (three) times daily.  04/22/15  Yes [provider]  hydrOXYzine (ATARAX/VISTARIL) 25 MG tablet Take 25 mg by mouth 3 (three) times daily. 06/02/15  Yes [provider]  tamsulosin (FLOMAX) 0.4 MG CAPS capsule Take 0.4 mg by mouth daily. 05/27/15  Yes [provider]  albuterol (PROVENTIL) (2.5 MG/3ML) 0.083% nebulizer solution Take 3 mLs (2.5 mg total) by nebulization every 6 (six) hours as needed for wheezing or shortness of breath. 08/08/16   Jola Schmidt, MD  azithromycin (ZITHROMAX) 250 MG tablet Take 1 tablet (250 mg total) by mouth daily. 08/08/16   Seona Clemenson, Bea Graff, PA-C  predniSONE (DELTASONE) 20 MG tablet Take 3 tablets (60 mg total) by mouth daily with breakfast. 08/08/16   Saroya Riccobono, Bea Graff, PA-C    Family History Family History  Problem Relation Age of Onset  .  Cancer Mother     unknown  . Cancer Cousin     unknown    Social History Social History  Substance Use Topics  . Smoking status: Former Smoker    Packs/day: 0.50    Years: 10.00    Quit date: 12/03/1994  . Smokeless tobacco: Never Used     Comment: stopped 15 years ago  . Alcohol use No     Comment: stopped 15 years ago     Allergies   Patient has no known allergies.   Review of Systems Review of Systems  Constitutional: Negative for chills and fever.  HENT:  Negative for facial swelling and sore throat.   Respiratory: Positive for shortness of breath.   Cardiovascular: Positive for chest pain (with coughing only).  Gastrointestinal: Negative for abdominal pain, nausea and vomiting.  Genitourinary: Negative for dysuria.  Musculoskeletal: Negative for back pain.  Skin: Negative for rash and wound.  Neurological: Positive for dizziness and headaches (slight).  Psychiatric/Behavioral: The patient is not nervous/anxious.      Physical Exam Updated Vital Signs BP 135/75   Pulse 79   Temp 97.7 F (36.5 C) (Oral)   Resp 15   Ht 6\' 1"  (1.854 m)   Wt 74.8 kg   SpO2 100%   BMI 21.77 kg/m   Physical Exam  Constitutional: He appears well-developed and well-nourished. No distress.  HENT:  Head: Normocephalic and atraumatic.  Mouth/Throat: Oropharynx is clear and moist. No oropharyngeal exudate.  Eyes: Conjunctivae are normal. Pupils are equal, round, and reactive to light. Right eye exhibits no discharge. Left eye exhibits no discharge. No scleral icterus.  Neck: Normal range of motion. Neck supple. No thyromegaly present.  Cardiovascular: Normal rate, regular rhythm, normal heart sounds and intact distal pulses.  Exam reveals no gallop and no friction rub.   No murmur heard. Pulmonary/Chest: Effort normal. No stridor. No respiratory distress. He has wheezes (expiratory). He has rhonchi (expiratory in bases bilaterally). He has no rales.  Abdominal: Soft. Bowel sounds are normal. He exhibits no distension. There is no tenderness. There is no rebound and no guarding.  Musculoskeletal: He exhibits no edema.  Lymphadenopathy:    He has no cervical adenopathy.  Neurological: He is alert. Coordination normal.  Skin: Skin is warm and dry. No rash noted. He is not diaphoretic. No pallor.  Psychiatric: He has a normal mood and affect.  Nursing note and vitals reviewed.    ED Treatments / Results  Labs (all labs ordered are listed, but only  abnormal results are displayed) Labs Reviewed  BASIC METABOLIC PANEL - Abnormal; Notable for the following:       Result Value   Glucose, Bld 106 (*)    All other components within normal limits  CBC WITH DIFFERENTIAL/PLATELET - Abnormal; Notable for the following:    WBC 3.5 (*)    RBC 4.20 (*)    Hemoglobin 11.5 (*)    HCT 35.4 (*)    Neutro Abs 1.6 (*)    Eosinophils Absolute 0.8 (*)    All other components within normal limits  I-STAT TROPOININ, ED    EKG  EKG Interpretation  Date/Time:  Sunday Aug 08 2016 08:58:01 EDT Ventricular Rate:  91 PR Interval:    QRS Duration: 127 QT Interval:  380 QTC Calculation: 468 R Axis:   62 Text Interpretation:  Sinus rhythm Right bundle branch block nonspecific st changes No significant change was found Confirmed by CAMPOS  MD, KEVIN ([id number]) on 08/08/2016  11:05:43 AM       Radiology Dg Chest 2 View  Result Date: 08/08/2016 CLINICAL DATA:  Congestion and productive cough. EXAM: CHEST  2 VIEW COMPARISON:  10/28/2015 FINDINGS: The heart size and mediastinal contours are within normal limits. Stable appearance of emphysematous lung disease. There is no evidence of pulmonary edema, consolidation, pneumothorax, nodule or pleural fluid. The visualized skeletal structures are unremarkable. IMPRESSION: No active cardiopulmonary disease. Electronically Signed   By: Aletta Edouard M.D.   On: 08/08/2016 09:20    Procedures Procedures (including critical care time)  Medications Ordered in ED Medications  ipratropium-albuterol (DUONEB) 0.5-2.5 (3) MG/3ML nebulizer solution 3 mL (3 mLs Nebulization Given 08/08/16 1010)  ipratropium-albuterol (DUONEB) 0.5-2.5 (3) MG/3ML nebulizer solution 3 mL (3 mLs Nebulization Given 08/08/16 1141)  predniSONE (DELTASONE) tablet 60 mg (60 mg Oral Given 08/08/16 1239)  azithromycin (ZITHROMAX) tablet 500 mg (500 mg Oral Given 08/08/16 1240)     Initial Impression / Assessment and Plan / ED Course  I have reviewed the  triage vital signs and the nursing notes.  Pertinent labs & imaging results that were available during my care of the patient were reviewed by me and considered in my medical decision making (see chart for details).     CBC shows hemoglobin 11.5. BMP, troponin WNL. CXR shows no active cardiopulmonary disease. EKG shows NSR without any significant change since last tracing. Patient with probable COPD exacerbation. Patient is unsure if he has ever been diagnosed with COPD, however symptoms are consistent and improved with DuoNeb in the ED. Rhonchi still present, however wheezing resolved. Patient having more productive sputum after nebulizer treatments. Patient ambulated 98-100% on room air without shortness of breath or chest pain prior to discharge. Will treat exacerbation with burst of prednisone, azithromycin. Patient to follow-up with pulmonologist for further evaluation and treatment. Return precautions discussed. Patient understands and agrees with plan. Patient vitals stable throughout ED course and discharged in satisfactory condition. Patient also evaluated by Dr. Venora Maples who guided the patient's management and agrees with plan.  Final Clinical Impressions(s) / ED Diagnoses   Final diagnoses:  SOB (shortness of breath)  Cough    New Prescriptions New Prescriptions   AZITHROMYCIN (ZITHROMAX) 250 MG TABLET    Take 1 tablet (250 mg total) by mouth daily.   PREDNISONE (DELTASONE) 20 MG TABLET    Take 3 tablets (60 mg total) by mouth daily with breakfast.     Frederica Kuster, PA-C 08/08/16 Ralston, Kevin, MD 08/08/16 1537

## 2016-08-08 NOTE — ED Notes (Signed)
98-100% RA. PT DENIES SOB AND CP WITH AMBULATION. NO DIFFICULTY WITH ACTIVITY.

## 2016-08-08 NOTE — ED Triage Notes (Signed)
Pt reports SOB and productive cough for the past 1.5 weeks. Pt not on chemo. Pt on on radiation for prostate cancer.

## 2016-11-09 ENCOUNTER — Ambulatory Visit (HOSPITAL_COMMUNITY)
Admission: EM | Admit: 2016-11-09 | Discharge: 2016-11-09 | Disposition: A | Discharge status: Home / Self Care | Payer: Medicare Other | Attending: Family Medicine | Admitting: Family Medicine

## 2016-11-09 ENCOUNTER — Encounter (HOSPITAL_COMMUNITY): Payer: Self-pay | Admitting: Emergency Medicine

## 2016-11-09 DIAGNOSIS — J40 Bronchitis, not specified as acute or chronic: Secondary | ICD-10-CM | POA: Diagnosis not present

## 2016-11-09 MED ORDER — BENZONATATE 100 MG PO CAPS: 100.0000 mg | ORAL_CAPSULE | Freq: Three times a day (TID) | ORAL | 0 refills | Status: DC

## 2016-11-09 MED ORDER — ALBUTEROL SULFATE (2.5 MG/3ML) 0.083% IN NEBU: 2.5000 mg | INHALATION_SOLUTION | Freq: Four times a day (QID) | RESPIRATORY_TRACT | 12 refills | Status: DC | PRN

## 2016-11-09 MED ORDER — ALBUTEROL SULFATE HFA 108 (90 BASE) MCG/ACT IN AERS: 1.0000 | INHALATION_SPRAY | Freq: Four times a day (QID) | RESPIRATORY_TRACT | 0 refills | Status: DC | PRN

## 2016-11-09 MED ORDER — PREDNISONE 50 MG PO TABS: ORAL_TABLET | ORAL | 0 refills | Status: DC

## 2016-11-09 MED ORDER — AZITHROMYCIN 250 MG PO TABS
250.0000 mg | ORAL_TABLET | Freq: Every day | ORAL | 0 refills | Status: DC
Start: 2016-11-09 — End: 2016-11-22

## 2016-11-09 NOTE — Discharge Instructions (Signed)
I am treating you for bronchitis. I have prescribed Azithromycin. Take 2 tablets today, then 1 tablet daily till finished. I have also prescribed a steroid called prednisone. Take one tablet daily with food. For cough, I have prescribed a medication called Tessalon. Take 1 tablet every 8 hours as needed for your cough. Should your symptoms fail to improve or worsen, follow up with your primary care provider, or return to clinic.   I have also refilled your albuterol inhaler and nebulizer solution. Use as needed.

## 2016-11-09 NOTE — ED Provider Notes (Signed)
[facility]   [id number] 11/09/16 Arrival Time: 1000  ASSESSMENT & PLAN:  1. Bronchitis     Meds ordered this encounter  Medications  . albuterol (PROVENTIL HFA;VENTOLIN HFA) 108 (90 Base) MCG/ACT inhaler    Sig: Inhale 1-2 puffs into the lungs every 6 (six) hours as needed for wheezing or shortness of breath.    Dispense:  1 Inhaler    Refill:  0    Order Specific Question:   Supervising Provider    Answer:   Vanessa Kick [id number]  . albuterol (PROVENTIL) (2.5 MG/3ML) 0.083% nebulizer solution    Sig: Take 3 mLs (2.5 mg total) by nebulization every 6 (six) hours as needed for wheezing or shortness of breath.    Dispense:  75 mL    Refill:  12    Order Specific Question:   Supervising Provider    Answer:   Vanessa Kick [[id number]]  . azithromycin (ZITHROMAX) 250 MG tablet    Sig: Take 1 tablet (250 mg total) by mouth daily. Take first 2 tablets together, then 1 every day until finished.    Dispense:  6 tablet    Refill:  0    Order Specific Question:   Supervising Provider    Answer:   Vanessa Kick [id number]  . predniSONE (DELTASONE) 50 MG tablet    Sig: Take 1 tablet daily with food    Dispense:  5 tablet    Refill:  0    Order Specific Question:   Supervising Provider    AnswerVanessa Kick [[id number]]  . benzonatate (TESSALON) 100 MG capsule    Sig: Take 1 capsule (100 mg total) by mouth every 8 (eight) hours.    Dispense:  21 capsule    Refill:  0    Order Specific Question:   Supervising Provider    Answer:   Vanessa Kick [[id number]]    Reviewed expectations re: course of current medical issues. Questions answered. Outlined signs and symptoms indicating need for more acute intervention. Patient verbalized understanding. After Visit Summary given.   SUBJECTIVE:  Anthony Duffy is a 76 y.o. male who presents with complaint of Cough with yellow mucus for 2 weeks. Along with fatigue. Denies any fever, chills, or sore throat, denies chest pain or  palpitations, has had shortness of breath, has a past history of emphysema, has been using his albuterol nebulizer, with some relief. States normal when he is well he does not have to use these medications. He has been eating and drinking, no urinary complaints.  ROS: As per HPI, remainder of ROS negative.   OBJECTIVE:  Vitals:   11/09/16 1045  BP: (!) 156/92  Pulse: 75  Temp: 98.1 F (36.7 C)  TempSrc: Oral  SpO2: 97%     General appearance: alert; no distress HEENT: normocephalic; atraumatic; conjunctivae normal; TMs normal, oropharynx clear without erythema or edema, no tonsillar exudate Neck: No tender cervical lymphadenopathy Lungs: Bilateral inspiratory wheeze, no increased respiratory effort or signs of respiratory distress Heart: regular rate and rhythm Abdomen: soft, non-tender; bowel sounds normal; no masses or organomegaly; no guarding or rebound tenderness Back: no CVA tenderness Extremities: no cyanosis or edema; symmetrical with no gross deformities Skin: warm and dry Neurologic: Grossly normal Psychological:  alert and cooperative; normal mood and affect  Procedures:     Labs Reviewed - No data to display  No results found.  No Known Allergies  PMHx, SurgHx, SocialHx, Medications, and Allergies were reviewed in the Visit  Navigator and updated as appropriate.       Barnet Glasgow, NP 11/09/16 1146

## 2016-11-09 NOTE — ED Triage Notes (Signed)
Pt complains of upper respiratory congestion for 2 weeks.

## 2016-11-15 IMAGING — CR DG THORACIC SPINE 3V
3 series · 3 of 3 positions shown · non-contrast
Comparison: PA and lateral chest x-ray of today's date and of June 21, 2013

CLINICAL DATA: Prostate malignancy, occasional back pain but
nothing acute

EXAM:
THORACIC SPINE - 3 VIEWS

[t t-spine a.p.]
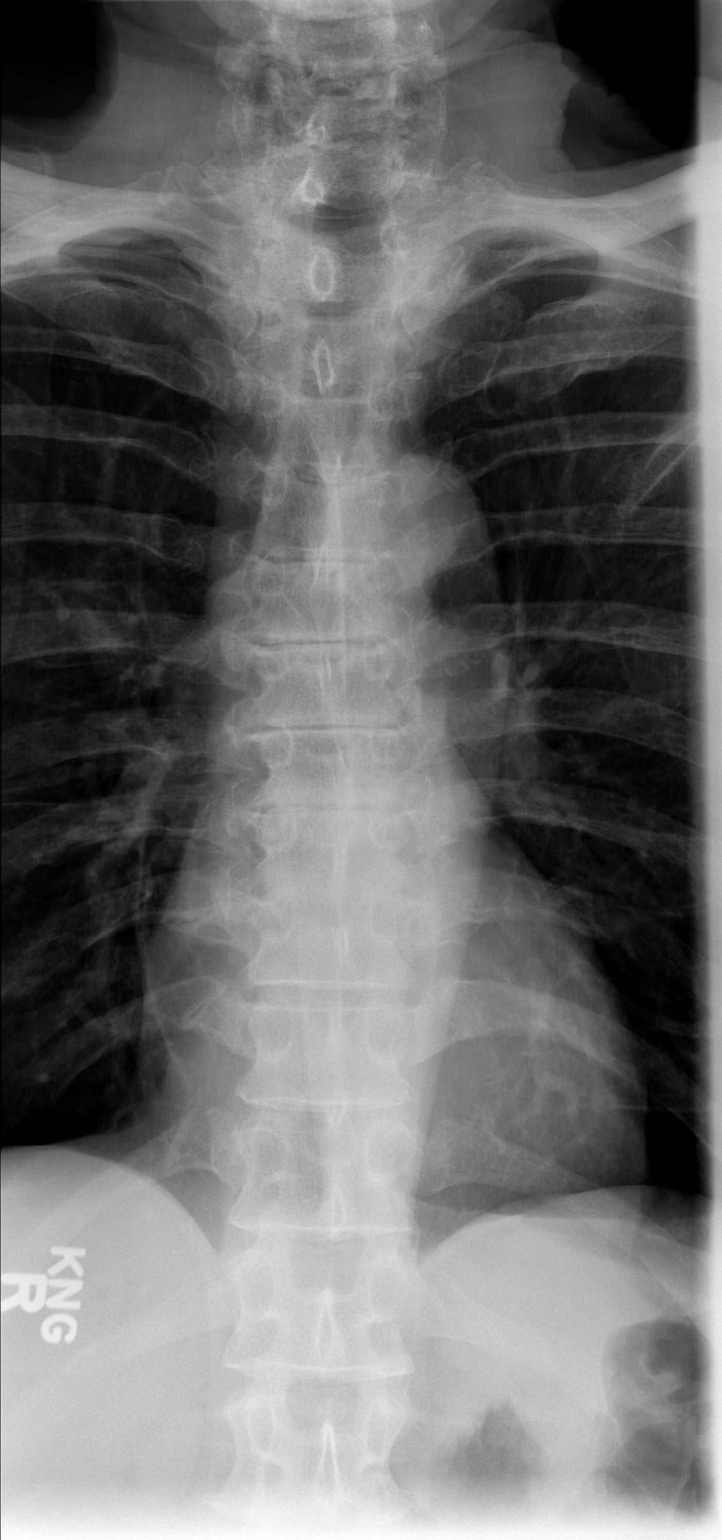

[t t-spine lat]
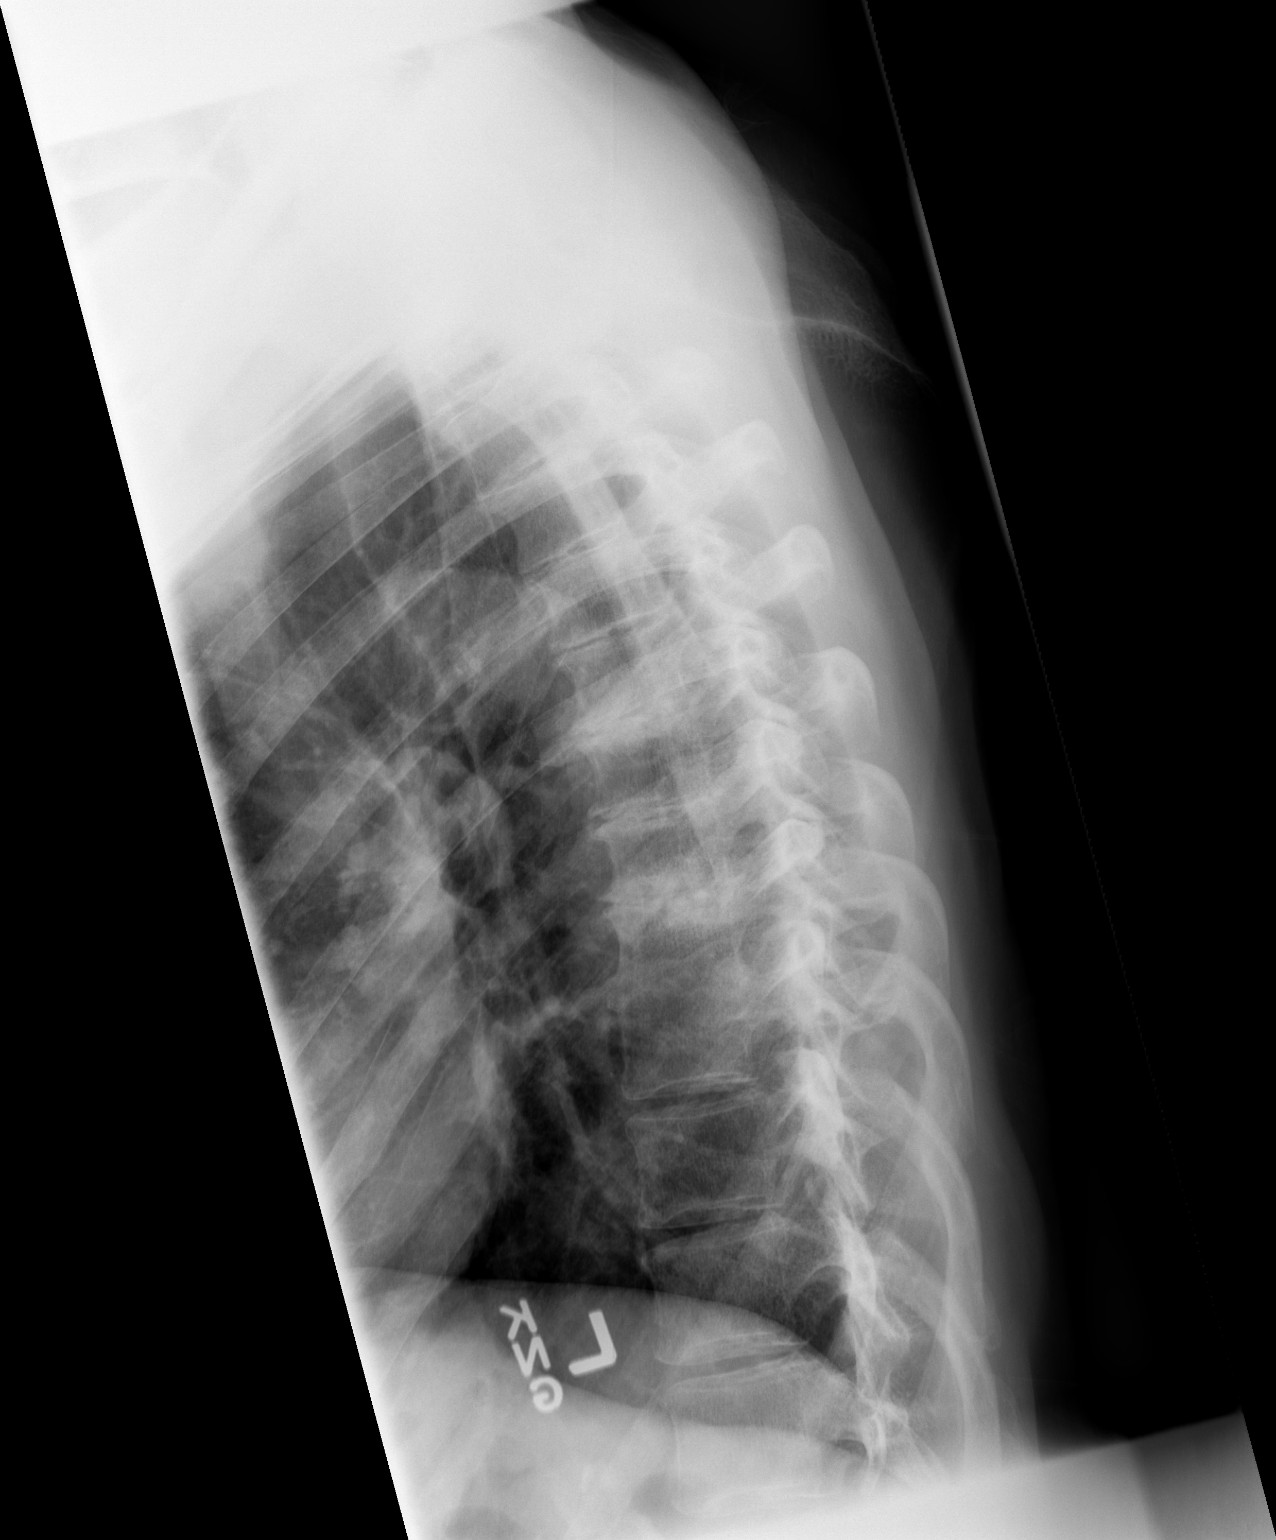

[t swimmers]
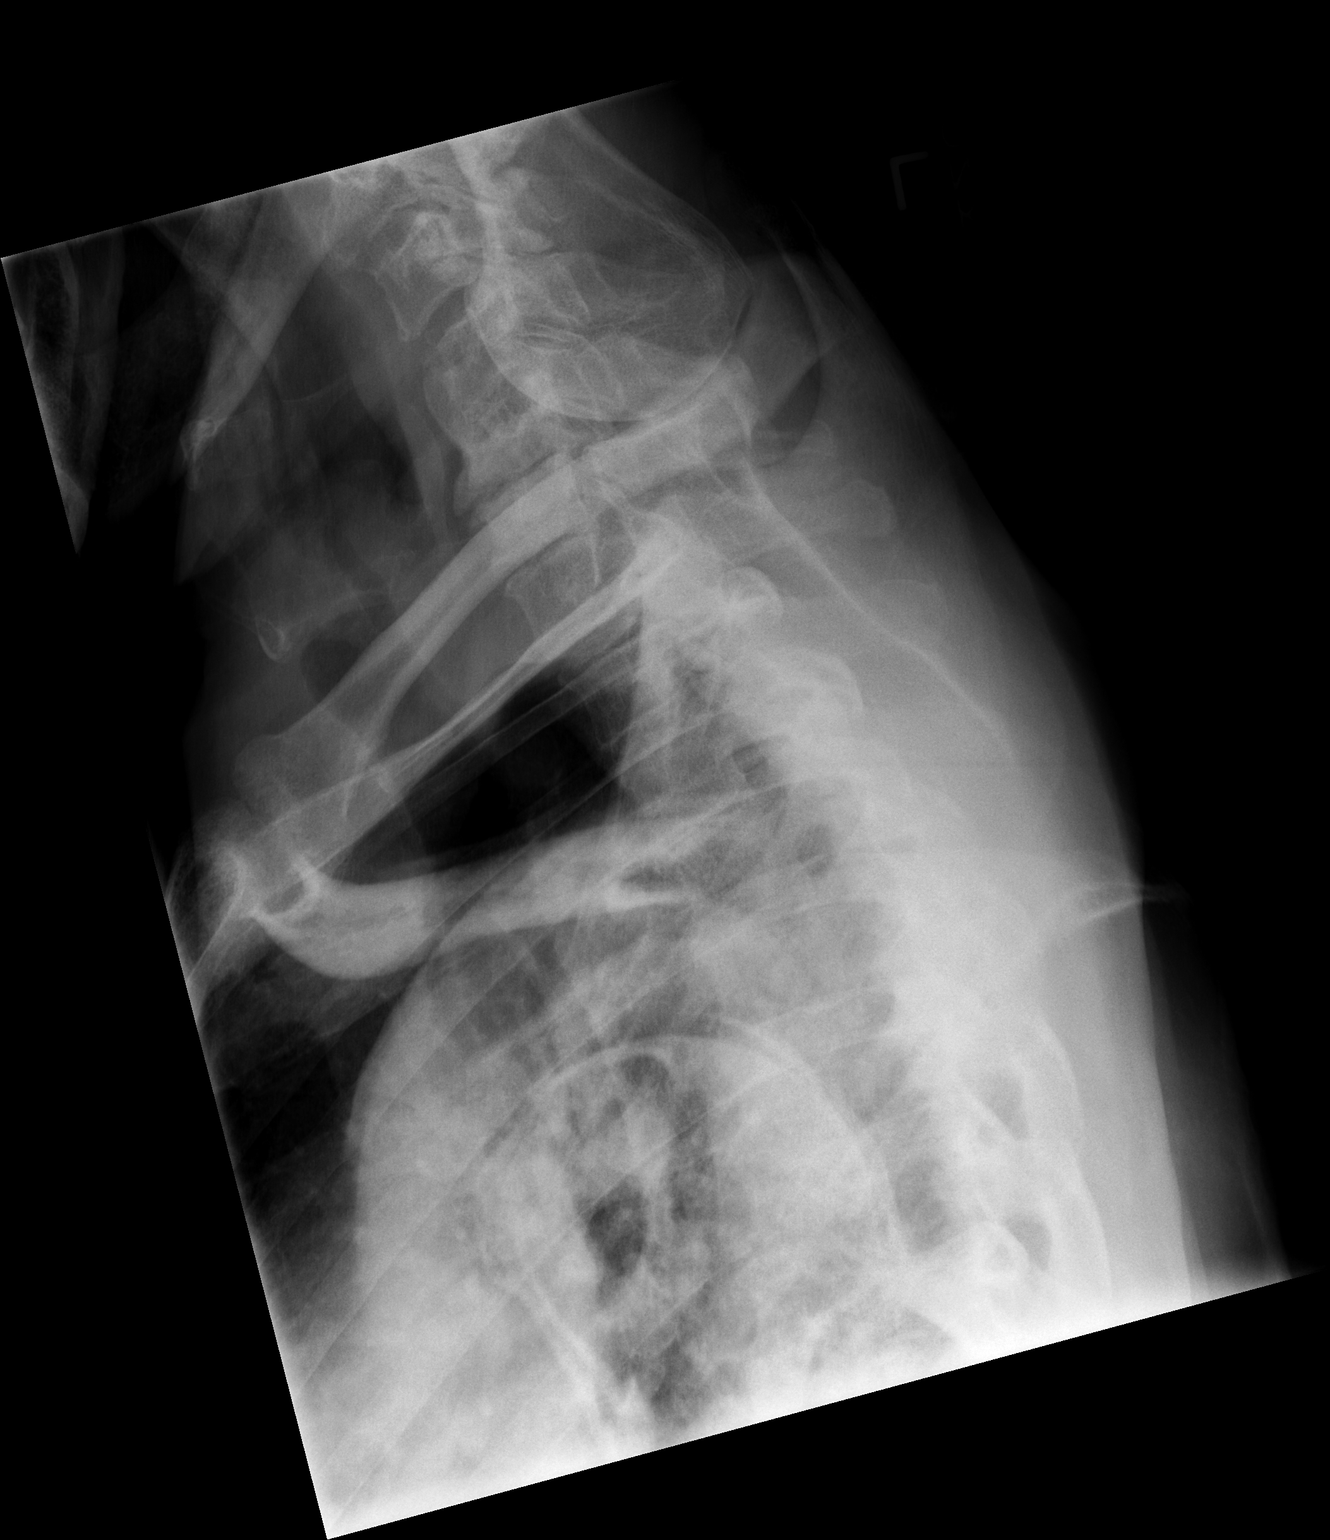

[3 of 3 positions shown; findings below may reference images not displayed]

FINDINGS: The thoracic vertebral bodies are preserved in height. There is
multilevel moderate degenerative disc space narrowing with endplate
sclerosis and osteophyte formation involving chiefly the mid
thoracic spine. The pedicles are intact. There are no abnormal
paravertebral soft tissue densities.
IMPRESSION: There is moderate multilevel degenerative disc disease of the mid
thoracic spine. There is no acute bony abnormality.

## 2016-11-22 ENCOUNTER — Encounter (HOSPITAL_COMMUNITY): Payer: Self-pay | Admitting: Emergency Medicine

## 2016-11-22 ENCOUNTER — Ambulatory Visit (HOSPITAL_COMMUNITY)
Admission: EM | Admit: 2016-11-22 | Discharge: 2016-11-22 | Disposition: A | Discharge status: Home / Self Care | Payer: Medicare Other | Attending: Family Medicine | Admitting: Family Medicine

## 2016-11-22 DIAGNOSIS — R0602 Shortness of breath: Secondary | ICD-10-CM

## 2016-11-22 DIAGNOSIS — J441 Chronic obstructive pulmonary disease with (acute) exacerbation: Secondary | ICD-10-CM | POA: Diagnosis not present

## 2016-11-22 DIAGNOSIS — R062 Wheezing: Secondary | ICD-10-CM

## 2016-11-22 MED ORDER — IPRATROPIUM-ALBUTEROL 0.5-2.5 (3) MG/3ML IN SOLN
RESPIRATORY_TRACT | Status: AC
  Filled 2016-11-22: qty 3

## 2016-11-22 MED ORDER — METHYLPREDNISOLONE ACETATE 80 MG/ML IJ SUSP
INTRAMUSCULAR | Status: AC
  Filled 2016-11-22: qty 1

## 2016-11-22 MED ORDER — IPRATROPIUM-ALBUTEROL 0.5-2.5 (3) MG/3ML IN SOLN
3.0000 mL | Freq: Once | RESPIRATORY_TRACT | Status: AC
Start: 2016-11-22 — End: 2016-11-22
  Administered 2016-11-22: 3 mL via RESPIRATORY_TRACT

## 2016-11-22 MED ORDER — IPRATROPIUM-ALBUTEROL 0.5-2.5 (3) MG/3ML IN SOLN: 3.0000 mL | Freq: Four times a day (QID) | RESPIRATORY_TRACT | 1 refills | Status: DC | PRN

## 2016-11-22 MED ORDER — METHYLPREDNISOLONE ACETATE 80 MG/ML IJ SUSP
80.0000 mg | Freq: Once | INTRAMUSCULAR | Status: AC
  Administered 2016-11-22: 80 mg via INTRAMUSCULAR

## 2016-11-22 NOTE — Discharge Instructions (Signed)
I will you to return in 48 hours if you're not noticing a significant improvement in breathing.

## 2016-11-22 NOTE — ED Provider Notes (Signed)
[facility]    CSN: [id number] Arrival date & time: 11/22/16  1249     History   Chief Complaint Chief Complaint  Patient presents with  . Shortness of Breath    HPI Anthony Duffy is a 76 y.o. male.   Is a 76 year old gentleman with shortness of breath for a month associated with wheezing and foamy white sputum production. Patient quit smoking 30 years ago, but he's had intermittent problems with wheezing. This episode is a continuation from his visit 2 weeks ago. He says he is about a quarter better. He uses a nebulizer and a inhaler at home that contain albuterol      Past Medical History:  Diagnosis Date  . Arthritis   . Asthma    as child  . Bronchitis   . Hypertension   . Prostate cancer ([place]) 10/14/2014    Patient Active Problem List   Diagnosis Date Noted  . Malignant neoplasm of prostate ([place]) 12/03/2014    Past Surgical History:  Procedure Laterality Date  . CARDIAC SURGERY    . CATARACT EXTRACTION W/PHACO  01/05/2012   Procedure: CATARACT EXTRACTION PHACO AND INTRAOCULAR LENS PLACEMENT (IOC);  Surgeon: Adonis Brook, MD;  Location: [facility];  Service: Ophthalmology;  Laterality: Right;  . FINGER SURGERY     s/p injury 30 years ago  . PROSTATE BIOPSY  10/14/2014       Home Medications    Prior to Admission medications   Medication Sig Start Date End Date Taking? Authorizing Provider  albuterol (PROVENTIL HFA;VENTOLIN HFA) 108 (90 Base) MCG/ACT inhaler Inhale 1-2 puffs into the lungs every 6 (six) hours as needed for wheezing or shortness of breath. 11/09/16  Yes Barnet Glasgow, NP  albuterol (PROVENTIL) (2.5 MG/3ML) 0.083% nebulizer solution Take 3 mLs (2.5 mg total) by nebulization every 6 (six) hours as needed for wheezing or shortness of breath. 11/09/16  Yes Barnet Glasgow, NP  amLODipine (NORVASC) 5 MG tablet Take 5 mg by mouth daily. 08/01/16  Yes [provider]  benzonatate (TESSALON) 100 MG capsule Take 1 capsule (100 mg total)  by mouth every 8 (eight) hours. 11/09/16  Yes Barnet Glasgow, NP  Diphenhydramine-PE-APAP (THERAFLU EXPRESSMAX) 12.5-5-325 MG/15ML LIQD Take 15 mLs by mouth every 6 (six) hours as needed (cough).   Yes [provider]  gabapentin (NEURONTIN) 300 MG capsule 300 mg 3 (three) times daily.  04/22/15  Yes [provider]  chlorthalidone (HYGROTON) 25 MG tablet Take 0.5 tablets (12.5 mg total) by mouth daily. 11/22/11 08/08/17  Nita Sells, MD  hydrOXYzine (ATARAX/VISTARIL) 25 MG tablet Take 25 mg by mouth 3 (three) times daily. 06/02/15   [provider]  ipratropium-albuterol (DUONEB) 0.5-2.5 (3) MG/3ML SOLN Take 3 mLs by nebulization every 6 (six) hours as needed. 11/22/16   Robyn Haber, MD  tamsulosin (FLOMAX) 0.4 MG CAPS capsule Take 0.4 mg by mouth daily. 05/27/15   [provider]    Family History Family History  Problem Relation Age of Onset  . Cancer Mother        unknown  . Cancer Cousin        unknown    Social History Social History  Substance Use Topics  . Smoking status: Former Smoker    Packs/day: 0.50    Years: 10.00    Quit date: 12/03/1994  . Smokeless tobacco: Never Used     Comment: stopped 15 years ago  . Alcohol use No     Comment: stopped 15  years ago     Allergies   Patient has no known allergies.   Review of Systems Review of Systems  Constitutional: Positive for appetite change. Negative for fever.  Respiratory: Positive for chest tightness, shortness of breath and wheezing.   All other systems reviewed and are negative.    Physical Exam Triage Vital Signs ED Triage Vitals  Enc Vitals Group     BP 11/22/16 1354 (!) 141/80     Pulse Rate 11/22/16 1354 78     Resp --      Temp 11/22/16 1354 98.2 F (36.8 C)     Temp Source 11/22/16 1354 Oral     SpO2 11/22/16 1354 97 %     Weight --      Height --      Head Circumference --      Peak Flow --      Pain Score 11/22/16 1351 0     Pain Loc --       Pain Edu? --      Excl. in [place]? --    No data found.   Updated Vital Signs BP (!) 141/80 (BP Location: Left Arm)   Pulse 78   Temp 98.2 F (36.8 C) (Oral)   SpO2 97%    Physical Exam  Constitutional: He is oriented to person, place, and time. He appears well-developed and well-nourished.  HENT:  Right Ear: External ear normal.  Left Ear: External ear normal.  Multiple missing teeth but no obvious gingivitis  Eyes: Pupils are equal, round, and reactive to light. Conjunctivae and EOM are normal.  Neck: Normal range of motion. Neck supple.  Neck veins distended halfway up when sitting  Cardiovascular: Normal rate, regular rhythm and normal heart sounds.   Pulmonary/Chest: He has wheezes.  Labored breathing Bilateral inspiratory and expiratory wheezes  Abdominal: Soft.  Musculoskeletal: Normal range of motion.  Neurological: He is alert and oriented to person, place, and time.  Skin: Skin is warm and dry.  Nursing note and vitals reviewed.    UC Treatments / Results  Labs (all labs ordered are listed, but only abnormal results are displayed) Labs Reviewed - No data to display  EKG  EKG Interpretation None       Radiology No results found.  Procedures Procedures (including critical care time)  Medications Ordered in UC Medications  methylPREDNISolone acetate (DEPO-MEDROL) injection 80 mg (not administered)  ipratropium-albuterol (DUONEB) 0.5-2.5 (3) MG/3ML nebulizer solution 3 mL (3 mLs Nebulization Given 11/22/16 1424)     Initial Impression / Assessment and Plan / UC Course  I have reviewed the triage vital signs and the nursing notes.  Pertinent labs & imaging results that were available during my care of the patient were reviewed by me and considered in my medical decision making (see chart for details).     Final Clinical Impressions(s) / UC Diagnoses   Final diagnoses:  COPD exacerbation ([place])    New Prescriptions New Prescriptions    IPRATROPIUM-ALBUTEROL (DUONEB) 0.5-2.5 (3) MG/3ML SOLN    Take 3 mLs by nebulization every 6 (six) hours as needed.     Controlled Substance Prescriptions  Controlled Substance Registry consulted? Not Applicable   Robyn Haber, MD 11/22/16 1434

## 2016-11-22 NOTE — ED Triage Notes (Addendum)
Pt has been suffering from SOB for several weeks with a very productive cough.  Pt reports a clear, frothy sputum.  He was seen here on 11/09/16 and completed all of his prednisone, abx, and Tessalon pearls.  He states that made him feel a little better but now he feels he is getting worse.  He denies CP, numbness or tingling, but does report some episodes of sweating and dizziness.   He has been using his rescue inhaler and his nebulizer several times a day with no relief.  He will be SOB at rest or with exertion.

## 2016-11-24 ENCOUNTER — Ambulatory Visit (INDEPENDENT_AMBULATORY_CARE_PROVIDER_SITE_OTHER): Payer: Medicare Other

## 2016-11-24 ENCOUNTER — Ambulatory Visit (HOSPITAL_COMMUNITY)
Admission: EM | Admit: 2016-11-24 | Discharge: 2016-11-24 | Disposition: A | Discharge status: Home / Self Care | Payer: Medicare Other | Attending: Family Medicine | Admitting: Family Medicine

## 2016-11-24 ENCOUNTER — Encounter (HOSPITAL_COMMUNITY): Payer: Self-pay | Admitting: Emergency Medicine

## 2016-11-24 DIAGNOSIS — J441 Chronic obstructive pulmonary disease with (acute) exacerbation: Secondary | ICD-10-CM

## 2016-11-24 DIAGNOSIS — R05 Cough: Secondary | ICD-10-CM

## 2016-11-24 DIAGNOSIS — R059 Cough, unspecified: Secondary | ICD-10-CM

## 2016-11-24 MED ORDER — PREDNISONE 10 MG PO TABS: ORAL_TABLET | ORAL | 0 refills | Status: DC

## 2016-11-24 MED ORDER — HYDROCODONE-HOMATROPINE 5-1.5 MG/5ML PO SYRP: 5.0000 mL | ORAL_SOLUTION | Freq: Four times a day (QID) | ORAL | 0 refills | Status: DC | PRN

## 2016-11-24 MED ORDER — METHYLPREDNISOLONE SODIUM SUCC 125 MG IJ SOLR
125.0000 mg | Freq: Once | INTRAMUSCULAR | Status: AC
  Administered 2016-11-24: 125 mg via INTRAMUSCULAR

## 2016-11-24 MED ORDER — AMOXICILLIN 875 MG PO TABS: 875.0000 mg | ORAL_TABLET | Freq: Two times a day (BID) | ORAL | 0 refills | Status: DC

## 2016-11-24 MED ORDER — METHYLPREDNISOLONE SODIUM SUCC 125 MG IJ SOLR
INTRAMUSCULAR | Status: AC
  Filled 2016-11-24: qty 2

## 2016-11-24 NOTE — ED Provider Notes (Signed)
[place]   [id number] 11/24/16 Arrival Time: 1109  ASSESSMENT & PLAN:  1. COPD exacerbation ([place])   2. Cough     Meds ordered this encounter  Medications  . predniSONE (DELTASONE) 10 MG tablet    Sig: Take 4 po qd x 2d then 3po qd x 2d then 2 po qd x 2d then 1 po qd x 2d then stop    Dispense:  20 tablet    Refill:  0    Order Specific Question:   Supervising Provider    AnswerVanessa Kick [[id number]]  . HYDROcodone-homatropine (HYCODAN) 5-1.5 MG/5ML syrup    Sig: Take 5 mLs by mouth every 6 (six) hours as needed for cough.    Dispense:  120 mL    Refill:  0    Order Specific Question:   Supervising Provider    Answer:   Vanessa Kick [id number]  . amoxicillin (AMOXIL) 875 MG tablet    Sig: Take 1 tablet (875 mg total) by mouth 2 (two) times daily.    Dispense:  20 tablet    Refill:  0    Order Specific Question:   Supervising Provider    Answer:   Vanessa Kick [id number]  . methylPREDNISolone sodium succinate (SOLU-MEDROL) 125 mg/2 mL injection 125 mg    Reviewed expectations re: course of current medical issues. Questions answered. Outlined signs and symptoms indicating need for more acute intervention. Patient verbalized understanding. After Visit Summary given.  Continue Duo neb treatments every 6 hours   Follow up with PCP   SUBJECTIVE:  Anthony Duffy is a 76 y.o. male who presents with complaint of cough and worsening sx's.  He was seen a few days ago and rx'd duoneb and this is helping but he is having a lot of wheezing and SOB.  He was seen a couple weeks ago for bronchitis and COPD exacerbation and treated with steroids and abx's for 5 days and was better for a week and then he became symptomatic again after a week.  ROS: As per HPI.   OBJECTIVE:  Vitals:   11/24/16 1152  BP: (!) 153/77  Pulse: 82  Temp: 98.5 F (36.9 C)  TempSrc: Oral  SpO2: 94%     General appearance: alert; no distress Eyes: PERRLA; EOMI; conjunctiva  normal HENT: normocephalic; atraumatic; TMs normal; nasal mucosa normal; oral mucosa normal Neck: supple Lungs: Diminished throughout with scattered expiratory wheezes. Heart: regular rate and rhythm Abdomen: soft, non-tender; bowel sounds normal; no masses or organomegaly; no guarding or rebound tenderness Neurologic: normal gait; normal symmetric reflexes Psychological: alert and cooperative; normal mood and affect  Past Medical History:  Diagnosis Date  . Arthritis   . Asthma    as child  . Bronchitis   . Hypertension   . Prostate cancer ([place]) 10/14/2014     has a past medical history of Arthritis; Asthma; Bronchitis; Hypertension; and Prostate cancer ([place]) (10/14/2014).    Labs Reviewed - No data to display  Imaging: Dg Chest 2 View  Result Date: 11/24/2016 CLINICAL DATA:  Shortness of breath, cough, fever EXAM: CHEST  2 VIEW COMPARISON:  08/08/2016 FINDINGS: The lungs are hyperinflated likely secondary to COPD. There is no focal parenchymal opacity. There is no pleural effusion or pneumothorax. The heart and mediastinal contours are unremarkable. The osseous structures are unremarkable. IMPRESSION: No active cardiopulmonary disease. Electronically Signed   By: Kathreen Devoid   On: 11/24/2016 12:38    No Known Allergies  Family History  Problem Relation Age of Onset  . Cancer Mother        unknown  . Cancer Cousin        unknown   Past Surgical History:  Procedure Laterality Date  . CARDIAC SURGERY    . CATARACT EXTRACTION W/PHACO  01/05/2012   Procedure: CATARACT EXTRACTION PHACO AND INTRAOCULAR LENS PLACEMENT (IOC);  Surgeon: Adonis Brook, MD;  Location: [facility];  Service: Ophthalmology;  Laterality: Right;  . FINGER SURGERY     s/p injury 30 years ago  . PROSTATE BIOPSY  10/14/2014         Lysbeth Penner, FNP 11/24/16 1531

## 2016-11-24 NOTE — ED Triage Notes (Signed)
Pt reports feeling a little better since he was here two days ago, but is still concerned for his SOB and cough.  Pt has been seen here twice in the last 15 days for these symptoms.

## 2017-01-20 ENCOUNTER — Ambulatory Visit (INDEPENDENT_AMBULATORY_CARE_PROVIDER_SITE_OTHER): Payer: Medicare Other | Admitting: Internal Medicine

## 2017-01-20 ENCOUNTER — Encounter: Payer: Self-pay | Admitting: Internal Medicine

## 2017-01-20 VITALS — BP 120/64 | HR 64 | Ht 72.0 in | Wt 176.0 lb

## 2017-01-20 DIAGNOSIS — J449 Chronic obstructive pulmonary disease, unspecified: Secondary | ICD-10-CM | POA: Insufficient documentation

## 2017-01-20 DIAGNOSIS — I10 Essential (primary) hypertension: Secondary | ICD-10-CM

## 2017-01-20 MED ORDER — BUDESONIDE-FORMOTEROL FUMARATE 80-4.5 MCG/ACT IN AERO: INHALATION_SPRAY | RESPIRATORY_TRACT | 11 refills | Status: DC

## 2017-01-20 MED ORDER — LOSARTAN POTASSIUM-HCTZ 50-12.5 MG PO TABS: 1.0000 | ORAL_TABLET | Freq: Every day | ORAL | 11 refills | Status: AC

## 2017-01-20 MED ORDER — MOMETASONE FURO-FORMOTEROL FUM 100-5 MCG/ACT IN AERO: 2.0000 | INHALATION_SPRAY | Freq: Every day | RESPIRATORY_TRACT | 0 refills | Status: DC

## 2017-01-20 NOTE — Assessment & Plan Note (Signed)
Try off acei 01/20/2017 due to pseudowheeze / noct choking   In the best review of chronic cough to date ( NEJM 2016 375 [id number]) ,  ACEi are now felt to cause cough in up to  20% of pts which is a 4 fold increase from previous reports and does not include the variety of non-specific complaints we see in pulmonary clinic in pts on ACEi but previously attributed to another dx like  Copd/asthma and  include PNDS, throat and chest congestion, "bronchitis", unexplained dyspnea and noct "strangling" sensations, and hoarseness, but also  atypical /refractory GERD symptoms like dysphagia and "bad heartburn"   The only way I know  to prove this is not an "ACEi Case" is a trial off ACEi x a minimum of 6 weeks then regroup.   Try losartan 50-12.5 mg daily - ok to double

## 2017-01-20 NOTE — Assessment & Plan Note (Addendum)
01/20/2017  After extensive coaching HFA effectiveness =    75% from a baseline of near zero - 01/20/2017 try dulera 100 2bid and off acei and return for pfts in 6 weeks   When respiratory symptoms begin or become refractory well after a patient reports complete smoking cessation,  Especially when this wasn't the case while they were smoking, a red flag is raised based on the work of Dr Kris Mouton which states:  if you quit smoking when your best day FEV1 is still well preserved it is highly unlikely you will progress to severe disease.  That is to say, once the smoking stops,  the symptoms should not suddenly erupt or markedly worsen.  If so, the differential diagnosis should include  obesity/deconditioning,  LPR/Reflux/Aspiration syndromes,  occult CHF, or  especially side effect of medications commonly used in this population, eg ACEi   First needs trial off acei and then f/u pfts in 6 weeks on trial of dulera 100 2bid    Total time devoted to counseling  > 50 % of initial 60 min office visit:  review case 9including ER hx in EPIC) with pt/ discussion of options/alternatives/ personally creating written customized instructions  in presence of pt  then going over those specific  Instructions directly with the pt including how to use all of the meds but in particular covering each new medication in detail and the difference between the maintenance= "automatic" meds and the prns using an action plan format for the latter (If this problem/symptom => do that organization reading Left to right).  Please see AVS from this visit for a full list of these instructions which I personally wrote for this pt and  are unique to this visit.

## 2017-01-20 NOTE — Patient Instructions (Addendum)
Stop lisnopril /HCTZ ( prinizide ) and start losartan 50-12.5 one daily and ok to double it if needed  Plan A = Automatic =  Dulera (blue) 100 = Symbicort 80 Take 2 puffs first thing in am and then another 2 puffs about 12 hours later.   .Work on inhaler technique:  relax and gently blow all the way out then take a nice smooth deep breath back in, triggering the inhaler at same time you start breathing in.  Hold for up to 5 seconds if you can. Blow out thru nose. Rinse and gargle with water when done      Plan B = Backup Only use your albuterol(Proair)  as a rescue medication to be used if you can't catch your breath by resting or doing a relaxed purse lip breathing pattern.  - The less you use it, the better it will work when you need it. - Ok to use the inhaler up to 2 puffs  every 4 hours if you must but call for appointment if use goes up over your usual need - Don't leave home without it !!  (think of it like the spare tire for your car)   Plan C = Crisis - only use your albuterol nebulizer if you first try Plan B and it fails to help > ok to use the nebulizer up to every 4 hours but if start needing it regularly call for immediate appointment   Please schedule a follow up office visit in 6 weeks with pfts  call sooner if needed with all medications /inhalers/ solutions in hand so we can verify exactly what you are taking. This includes all medications from all doctors and over the counters

## 2017-01-20 NOTE — Progress Notes (Signed)
Subjective:     Patient ID: Anthony Duffy, male   DOB: 08/13/1940,    MRN: [id number]  HPI  50 yowm quit smoking in 1994 referred to pulmonary clinic 01/20/2017 by Dr [facility] clinic for sob   01/20/2017 1st [facility] office visit/ [name]   Chief Complaint  Patient presents with  . Pulmonary Consult    Referred by [name]. Pt c/o SOB off and on for the past year. He states "I get tired real quick".   no trouble at rest sitting/ sometimes a little sob immediately on lying down like getting choked  Walks dog qod s sob / not regularly  In ER x 4 since 08/2016 with sob / "congestion" with white mucus    No obvious day to day or daytime variability or assoc excess/ purulent sputum or mucus plugs or hemoptysis or cp or chest tightness, subjective wheeze or overt sinus or hb symptoms. No unusual exp hx or h/o childhood pna/ asthma or knowledge of premature birth.  Sleeping ok flat without nocturnal  or early am exacerbation  of respiratory  c/o's or need for noct saba. Also denies any obvious fluctuation of symptoms with weather or environmental changes or other aggravating or alleviating factors except as outlined above   Current Allergies, Complete Past Medical History, Past Surgical History, Family History, and Social History were reviewed in [employer] record.  ROS  The following are not active complaints unless bolded Hoarseness, sore throat, dysphagia, dental problems, itching, sneezing,  nasal congestion or discharge of excess mucus or purulent secretions, ear ache,   fever, chills, sweats, unintended wt loss or wt gain, classically pleuritic or exertional cp,  orthopnea pnd or leg swelling, presyncope, palpitations, abdominal pain, anorexia, nausea, vomiting, diarrhea  or change in bowel habits or change in bladder habits, change in stools or change in urine, dysuria, hematuria,  rash, arthralgias, visual complaints, headache, numbness, weakness or ataxia or  problems with walking or coordination,  change in mood/affect or memory.        Current Meds  Medication Sig  . albuterol (PROVENTIL HFA;VENTOLIN HFA) 108 (90 Base) MCG/ACT inhaler Inhale 1-2 puffs into the lungs every 6 (six) hours as needed for wheezing or shortness of breath.  Marland Kitchen amLODipine (NORVASC) 10 MG tablet Take 10 mg by mouth daily.  Marland Kitchen gabapentin (NEURONTIN) 300 MG capsule 300 mg 3 (three) times daily.   Marland Kitchen ipratropium-albuterol (DUONEB) 0.5-2.5 (3) MG/3ML SOLN 1 vial in neb every 6 hours as needed  . tamsulosin (FLOMAX) 0.4 MG CAPS capsule Take 0.4 mg by mouth daily.  Marland Kitchen ] lisinopril-hydrochlorothiazide (PRINZIDE,ZESTORETIC) 10-12.5 MG tablet Take 1 tablet by mouth daily.          Review of Systems     Objective:   Physical Exam   amb bm nad  Wt Readings from Last 3 Encounters:  01/20/17 176 lb (79.8 kg)  08/08/16 165 lb (74.8 kg)  10/28/15 160 lb (72.6 kg)    Vital signs reviewed   - Note on arrival 02 sats  99% on RA      HEENT: nl  turbinates bilaterally, and oropharynx. Nl external ear canals without cough reflex - only a few teeth left.look ok  NECK :  without JVD/Nodes/TM/ nl carotid upstrokes bilaterally   LUNGS: no acc muscle use,  Nl contour chest  With late bilateral exp sonorous rhonchi /? pseudowheeze    CV:  RRR  no s3 or murmur or increase in P2, and  no edema   ABD:  soft and nontender with nl inspiratory excursion in the supine position. No bruits or organomegaly appreciated, bowel sounds nl  MS:  Nl gait/ ext warm without deformities, calf tenderness, cyanosis or clubbing No obvious joint restrictions   SKIN: warm and dry without lesions    NEURO:  alert, approp, nl sensorium with  no motor or cerebellar deficits apparent.      I personally reviewed images and agree with radiology impression as follows:  CXR:  11/24/16 No active cardiopulmonary disease.     Assessment:

## 2017-01-20 NOTE — Addendum Note (Signed)
Addended by: Rosana Berger on: 01/20/2017 11:26 AM   Modules accepted: Orders

## 2017-03-11 ENCOUNTER — Ambulatory Visit (INDEPENDENT_AMBULATORY_CARE_PROVIDER_SITE_OTHER): Payer: Medicare Other | Admitting: Internal Medicine

## 2017-03-11 ENCOUNTER — Encounter: Payer: Self-pay | Admitting: Internal Medicine

## 2017-03-11 VITALS — BP 142/86 | HR 86 | Temp 97.0°F | Ht 72.0 in | Wt 177.0 lb

## 2017-03-11 DIAGNOSIS — J449 Chronic obstructive pulmonary disease, unspecified: Secondary | ICD-10-CM | POA: Diagnosis not present

## 2017-03-11 DIAGNOSIS — I10 Essential (primary) hypertension: Secondary | ICD-10-CM | POA: Diagnosis not present

## 2017-03-11 LAB — PULMONARY FUNCTION TEST
FEF 25-75 Pre: 0.21 L/sec
FEF2575-%Pred-Pre: 8 %
FEV1-%PRED-PRE: 17 %
FEV1-PRE: 0.52 L
FEV1FVC-%Pred-Pre: 53 %
FEV6-%PRED-PRE: 32 %
FEV6-Pre: 1.24 L
FEV6FVC-%PRED-PRE: 100 %
FVC-%PRED-PRE: 32 %
FVC-Pre: 1.31 L
Pre FEV1/FVC ratio: 40 %
Pre FEV6/FVC Ratio: 95 %

## 2017-03-11 MED ORDER — BUDESONIDE-FORMOTEROL FUMARATE 160-4.5 MCG/ACT IN AERO: INHALATION_SPRAY | RESPIRATORY_TRACT | 12 refills | Status: DC

## 2017-03-11 MED ORDER — BUDESONIDE-FORMOTEROL FUMARATE 160-4.5 MCG/ACT IN AERO: 2.0000 | INHALATION_SPRAY | Freq: Two times a day (BID) | RESPIRATORY_TRACT | 0 refills | Status: DC

## 2017-03-11 MED ORDER — IPRATROPIUM-ALBUTEROL 0.5-2.5 (3) MG/3ML IN SOLN
3.0000 mL | Freq: Once | RESPIRATORY_TRACT | Status: AC
  Administered 2017-03-11: 3 mL via RESPIRATORY_TRACT

## 2017-03-11 MED ORDER — PREDNISONE 10 MG PO TABS: ORAL_TABLET | ORAL | 0 refills | Status: DC

## 2017-03-11 NOTE — Progress Notes (Signed)
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 office visit/ [name]   Chief Complaint  Patient presents with  . Pulmonary Consult    Referred by [name]. Pt c/o SOB off and on for the past year. He states "I get tired real quick".   no trouble at rest sitting/ sometimes a little sob immediately on lying down like getting choked  Walks dog qod s sob / not regularly  In ER x 4 since 08/2016 with sob / "congestion" with white mucus  rec Stop lisnopril /HCTZ ( prinizide ) and start losartan 50-12.5 one daily and ok to double it if needed Plan A = Automatic =  Dulera (blue) 100 = Symbicort 80 Take 2 puffs first thing in am and then another 2 puffs about 12 hours later.  Work on inhaler technique:  Plan B = Backup Only use your albuterol(Proair)  Plan C = Crisis - only use your albuterol nebulizer if you first try Plan B   Please schedule a follow up office visit in 6 weeks with pfts  call sooner if needed with all medications /inhalers/ solutions in hand so we can verify exactly what you are taking. This includes all medications from all doctors and over the counters     03/11/2017  f/u ov/[name] re:  [place] / confused with meds / did not bring as req  Chief Complaint  Patient presents with  . Follow-up    Pt c/o increased SOB, wheezing and prod cough with white sputum for the past wk.  He states he feels like he is gasping for air, even wakes up in the night with this. He has been using his albuterol inhaler and neb at least 2 x per day.    Am of ov 6 h prior to OV  = neb / has dulera but it's on 0 and pt not sure when that occurred, still has sprays left and over the last week gradually worse sob to point where sob at rest x after the neb  No  obvious day to day or daytime variability or assoc excess/ purulent sputum or mucus plugs or hemoptysis or cp or chest tightness, subjective wheeze or overt sinus or hb symptoms. No unusual exposure hx or h/o childhood pna/ asthma or knowledge of premature birth.  Sleeping ok flat without nocturnal  or early am exacerbation  of respiratory  c/o's or need for noct saba. Also denies any obvious fluctuation of symptoms with weather or environmental changes or other aggravating or alleviating factors except as outlined above   Current Allergies, Complete Past Medical History, Past Surgical History, Family History, and Social History were reviewed in [employer] record.  ROS  The following are not active complaints unless bolded Hoarseness, sore throat, dysphagia, dental problems, itching, sneezing,  nasal congestion or discharge of excess mucus or purulent secretions, ear ache,   fever, chills, sweats, unintended wt loss or wt gain, classically pleuritic or exertional cp,  orthopnea pnd or leg swelling, presyncope, palpitations, abdominal pain, anorexia, nausea, vomiting, diarrhea  or change in bowel habits or change in bladder habits, change in stools or change in urine, dysuria, hematuria,  rash, arthralgias, visual complaints, headache,  numbness, weakness or ataxia or problems with walking or coordination,  change in mood/affect or memory.        Current Meds  Medication Sig  . albuterol (PROVENTIL HFA;VENTOLIN HFA) 108 (90 Base) MCG/ACT inhaler Inhale 1-2 puffs into the lungs every 6 (six) hours as needed for wheezing or shortness of breath.  Marland Kitchen amLODipine (NORVASC) 10 MG tablet Take 10 mg by mouth daily.  Marland Kitchen gabapentin (NEURONTIN) 300 MG capsule 300 mg 3 (three) times daily.   Marland Kitchen ipratropium-albuterol (DUONEB) 0.5-2.5 (3) MG/3ML SOLN 1 vial in neb every 6 hours as needed  . losartan-hydrochlorothiazide (HYZAAR) 50-12.5 MG tablet Take 1 tablet by mouth daily.  . tamsulosin  (FLOMAX) 0.4 MG CAPS capsule Take 0.4 mg by mouth daily.  .                        Objective:   Physical Exam   wc bound elderly bm mild increased wob    03/11/2017       177   01/20/17 176 lb (79.8 kg)  08/08/16 165 lb (74.8 kg)  10/28/15 160 lb (72.6 kg)     Vital signs reviewed - Note on arrival 02 sats  96% on RA     HEENT: nl  turbinates bilaterally, and oropharynx. Nl external ear canals without cough reflex- only a few teeth remaining   NECK :  without JVD/Nodes/TM/ nl carotid upstrokes bilaterally   LUNGS: no acc muscle use,  Mod barrel chest, distant pan exp wheeze bilaterally    CV:  RRR  no s3 or murmur or increase in P2, and no edema   ABD:  soft and nontender with nl inspiratory excursion in the supine position. No bruits or organomegaly appreciated, bowel sounds nl  MS:  Nl gait/ ext warm without deformities, calf tenderness, cyanosis or clubbing No obvious joint restrictions   SKIN: warm and dry without lesions    NEURO:  alert, approp, nl sensorium with  no motor or cerebellar deficits apparent.              Assessment:

## 2017-03-11 NOTE — Progress Notes (Signed)
Attempted to complete PFT. Patient not feeling well and states that he will pass out if testing is continued.

## 2017-03-11 NOTE — Patient Instructions (Addendum)
duoneb x 1 treatment  Prednisone 10 mg take  4 each am x 2 days,   2 each am x 2 days,  1 each am x 2 days and stop   Plan A = Automatic = Symbicort 160 Take 2 puffs first thing in am and then another 2 puffs about 12 hours later.    Work on inhaler technique:  relax and gently blow all the way out then take a nice smooth deep breath back in, triggering the inhaler at same time you start breathing in.  Hold for up to 5 seconds if you can. Blow out thru nose. Rinse and gargle with water when done      Plan B = Backup Only use your albuterol Proair) as a rescue medication to be used if you can't catch your breath by resting or doing a relaxed purse lip breathing pattern.  - The less you use it, the better it will work when you need it. - Ok to use the inhaler up to 2 puffs  every 4 hours if you must but call for appointment if use goes up over your usual need - Don't leave home without it !!  (think of it like the spare tire for your car)   Plan C = Crisis - only use your albuterol/ipatropium nebulizer if you first try Plan B and it fails to help > ok to use the nebulizer up to every 4 hours but if start needing it regularly call for immediate appointment    Please schedule a follow up office visit in 4 weeks, sooner if needed  with all medications /inhalers/ solutions in hand so we can verify exactly what you are taking. This includes all medications from all doctors and over the counters

## 2017-03-12 ENCOUNTER — Encounter: Payer: Self-pay | Admitting: Internal Medicine

## 2017-03-12 NOTE — Assessment & Plan Note (Addendum)
01/20/2017  After extensive coaching HFA effectiveness =    75% from a baseline of near zero - 01/20/2017 try dulera 100 2bid and off acei   - Spirometry 03/11/2017  FEV1 0.52 (17%)  Ratio 40   - 03/11/2017  After extensive coaching HFA effectiveness =    75% try symb 160 x 6 week free trial then ov  - Advised: Formulary restrictions will be an ongoing challenge for the forseable future and I would be happy to pick an alternative if the pt will first  provide me a list of them but pt  will need to return here for training for any new device that is required eg dpi vs hfa vs respimat.    In meantime we can always provide samples so the patient never runs out of any needed respiratory medications (which is what clearly happened here)    DDX of  difficult airways management almost all start with A and  include Adherence, Ace Inhibitors, Acid Reflux, Active Sinus Disease, Alpha 1 Antitripsin deficiency, Anxiety masquerading as Airways dz,  ABPA,  Allergy(esp in young), Aspiration (esp in elderly), Adverse effects of meds,  Active smokers, A bunch of PE's (a small clot burden can't cause this syndrome unless there is already severe underlying pulm or vascular dz with poor reserve) plus two Bs  = Bronchiectasis and Beta blocker use..and one C= CHF    Adherence is always the initial "prime suspect" and is a multilayered concern that requires a "trust but verify" approach in every patient - starting with knowing how to use medications, especially inhalers, correctly, keeping up with refills and understanding the fundamental difference between maintenance and prns vs those medications only taken for a very short course and then stopped and not refilled.  - see hfa teaching - return with all meds in hand using a trust but verify approach to confirm accurate Medication  Reconciliation The principal here is that until we are certain that the  patients are doing what we've asked, it makes no sense to ask them to do  more.     ? acei effects > not clear he made change as confused with meds > return with active meds and strongly rec continue off acei   ? Allergy/ asthma component > Prednisone 10 mg take  4 each am x 2 days,   2 each am x 2 days,  1 each am x 2 days and stop    I had an extended discussion with the patient reviewing all relevant studies completed to date and  lasting 15 to 20 minutes of a 25 minute visit    Each maintenance medication was reviewed in detail including most importantly the difference between maintenance and prns and under what circumstances the prns are to be triggered using an action plan format that is not reflected in the computer generated alphabetically organized AVS.    Please see AVS for specific instructions unique to this visit that I personally wrote and verbalized to the the pt in detail and then reviewed with pt  by my nurse highlighting any  changes in therapy recommended at today's visit to their plan of care.

## 2017-03-12 NOTE — Assessment & Plan Note (Addendum)
Adequate control on present rx, reviewed in detail with pt > no change in rx needed  But need to be sure he's not still on acei when returns due to:  Although even in retrospect it may not be clear the ACEi contributed to the pt's symptoms,    adding them back at this point or in the future would risk confusion in interpretation of non-specific respiratory symptoms to which this patient is prone  ie  Better not to muddy the waters here.

## 2017-04-08 ENCOUNTER — Encounter: Payer: Self-pay | Admitting: Internal Medicine

## 2017-04-08 ENCOUNTER — Ambulatory Visit (INDEPENDENT_AMBULATORY_CARE_PROVIDER_SITE_OTHER): Payer: Medicare Other | Admitting: Internal Medicine

## 2017-04-08 VITALS — BP 120/68 | HR 78 | Ht 72.0 in | Wt 180.8 lb

## 2017-04-08 DIAGNOSIS — J449 Chronic obstructive pulmonary disease, unspecified: Secondary | ICD-10-CM

## 2017-04-08 DIAGNOSIS — I10 Essential (primary) hypertension: Secondary | ICD-10-CM

## 2017-04-08 MED ORDER — BUDESONIDE-FORMOTEROL FUMARATE 160-4.5 MCG/ACT IN AERO: 2.0000 | INHALATION_SPRAY | Freq: Two times a day (BID) | RESPIRATORY_TRACT | 0 refills | Status: DC

## 2017-04-08 NOTE — Progress Notes (Addendum)
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 Pulmonary Consult    Referred by [name]. Pt c/o SOB off and on for the past year. He states "I get tired real quick".   no trouble at rest sitting/ sometimes a little sob immediately on lying down like getting choked  Walks dog qod s sob / not regularly  In ER x 4 since 08/2016 with sob / "congestion" with white mucus  rec Stop lisnopril /HCTZ ( prinizide ) and start losartan 50-12.5 one daily and ok to double it if needed Plan A = Automatic =  Dulera (blue) 100 = Symbicort 80 Take 2 puffs first thing in am and then another 2 puffs about 12 hours later.  Work on inhaler technique:  Plan B = Backup Only use your albuterol(Proair)  Plan C = Crisis - only use your albuterol nebulizer if you first try Plan B   Please schedule a follow up office visit in 6 weeks with pfts  call sooner if needed with all medications /inhalers/ solutions in hand so we can verify exactly what you are taking. This includes all medications from all doctors and over the counters     03/11/2017  f/u ov/Macaila Tahir re: ? GOLD IV copd / confused with meds / did not bring as req  Chief Complaint  Patient presents with  . Follow-up    Pt c/o increased SOB, wheezing and prod cough with white sputum for the past wk.  He states he feels like he is gasping for air, even wakes up in the night with this. He has been using his albuterol inhaler and neb at least 2 x per day.    Am of ov 6 h prior to OV  = neb / has dulera but it's on 0 and pt not sure when that occurred, still has sprays left and over the last week gradually worse sob  to point where sob at rest x after the neb rec duoneb x 1 treatment Prednisone 10 mg take  4 each am x 2 days,   2 each am x 2 days,  1 each am x 2 days and stop  Plan A = Automatic = Symbicort 160 Take 2 puffs first thing in am and then another 2 puffs about 12 hours later.  Work on inhaler technique:   Plan B = Backup Only use your albuterol Proair) as a rescue medication Plan C = Crisis - only use your albuterol/ipatropium nebulizer if you first try Plan B and it fails to help > ok to use the nebulizer up to every 4 hours but if start needing it regularly call for immediate appointment Please schedule a follow up office visit in 4 weeks, sooner if needed  with all medications /inhalers/ solutions in hand > did not do    04/08/2017  f/u ov/Aidynn Krenn re:  Chief Complaint  Patient presents with  . Follow-up    Pt states he is doing good since last visit.  Denies any complaints of cough, SOB, or CP.   doing much better and now doe = MMRC2 =  can't walk a nl pace on a flat grade s sob but does fine slow and flat and little need for saba   No obvious day to day or daytime variability or assoc excess/ purulent sputum or mucus plugs or hemoptysis or cp or chest tightness, subjective wheeze or overt sinus or hb symptoms. No unusual exposure hx or h/o childhood pna/ asthma or knowledge of premature birth.  Sleeping ok flat without nocturnal  or early am exacerbation  of respiratory  c/o's or need for noct saba. Also denies any obvious fluctuation of symptoms with weather or environmental changes or other aggravating or alleviating factors except as outlined above   Current Allergies, Complete Past Medical History, Past Surgical History, Family History, and Social History were reviewed in [employer] record.  ROS  The following are not active complaints unless bolded Hoarseness, sore throat, dysphagia, dental problems, itching, sneezing,  nasal congestion or discharge of excess  mucus or purulent secretions, ear ache,   fever, chills, sweats, unintended wt loss or wt gain, classically pleuritic or exertional cp,  orthopnea pnd or leg swelling, presyncope, palpitations, abdominal pain, anorexia, nausea, vomiting, diarrhea  or change in bowel habits or change in bladder habits, change in stools or change in urine, dysuria, hematuria,  rash, arthralgias, visual complaints, headache, numbness, weakness or ataxia or problems with walking or coordination,  change in mood/affect or memory.        Current Meds  Medication Sig  . albuterol (PROVENTIL HFA;VENTOLIN HFA) 108 (90 Base) MCG/ACT inhaler Inhale 1-2 puffs into the lungs every 6 (six) hours as needed for wheezing or shortness of breath.  Marland Kitchen amLODipine (NORVASC) 10 MG tablet Take 10 mg by mouth daily.  . budesonide-formoterol (SYMBICORT) 160-4.5 MCG/ACT inhaler Take 2 puffs first thing in am and then another 2 puffs about 12 hours later.  . gabapentin (NEURONTIN) 300 MG capsule 300 mg 3 (three) times daily.   Marland Kitchen ipratropium-albuterol (DUONEB) 0.5-2.5 (3) MG/3ML SOLN 1 vial in neb every 6 hours as needed  . losartan-hydrochlorothiazide (HYZAAR) 50-12.5 MG tablet Take 1 tablet by mouth daily.  . tamsulosin (FLOMAX) 0.4 MG CAPS capsule Take 0.4 mg by mouth daily.                        Objective:   Physical Exam   amb bm nad no longer in w/c  04/08/2017          180  03/11/2017       177   01/20/17 176 lb (79.8 kg)  08/08/16 165 lb (74.8 kg)  10/28/15 160 lb (72.6 kg)    Vital signs reviewed - Note on arrival 02 sats  96% on RA       HEENT: Poor  dentition, turbinates bilaterally, and oropharynx. Nl external ear canals without cough reflex   NECK :  without JVD/Nodes/TM/ nl carotid upstrokes bilaterally   LUNGS: no acc muscle use, slt barrel  contour chest with distant bs and late exp rhonchi bilaterally s true wheeze    CV:  RRR  no s3 or murmur or increase in P2, and no edema   ABD:  soft and  nontender with nl inspiratory excursion in the supine position. No bruits or organomegaly appreciated, bowel sounds nl  MS:  Nl gait/ ext warm without deformities, calf tenderness, cyanosis or clubbing No obvious joint restrictions   SKIN: warm and dry without lesions    NEURO:  alert, approp, nl  sensorium with  no motor or cerebellar deficits apparent.                 Assessment:

## 2017-04-08 NOTE — Patient Instructions (Signed)
No change in medications  Work on inhaler technique:  relax and gently blow all the way out then take a nice smooth deep breath back in, triggering the inhaler at same time you start breathing in.  Hold for up to 5 seconds if you can. Blow out thru nose. Rinse and gargle with water when done     Please schedule a follow up visit in 3 months but call sooner if needed  with all medications /inhalers/ solutions in hand so we can verify exactly what you are taking. This includes all medications from all doctors and over the counters

## 2017-04-09 ENCOUNTER — Encounter: Payer: Self-pay | Admitting: Internal Medicine

## 2017-04-09 NOTE — Assessment & Plan Note (Signed)
Try off acei 01/20/2017 due to pseudowheeze / noct choking > resolved   Adequate control on present rx, reviewed in detail with pt > no change in rx needed  = hyzaar 50/12.5 daily and Follow up per Primary Care planned

## 2017-04-09 NOTE — Assessment & Plan Note (Addendum)
01/20/2017  After extensive coaching HFA effectiveness =    75% from a baseline of near zero - 01/20/2017 try dulera 100 2bid and off acei   - Spirometry 03/11/2017  FEV1 0.52 (17%)  Ratio 40   - 03/11/2017  After extensive coaching HFA effectiveness =    75% try symb 160 x 6 week free trial then ov  - Spirometry 04/08/2017  FEV1 1.66 (55%)  Ratio 61 p am symb 160 x 2    04/08/2017  After extensive coaching inhaler device  effectiveness =    90% now    He has clearly benefitted from access to samples of symbicort after only a short course of prednisone and his pfts show a tripling of his FEV1 c/w ACOS or asthma component that may well prove to be more relevant than the copd component  But rx is the same  I had an extended discussion with the patient reviewing all relevant studies completed to date and  lasting 15 to 20 minutes of a 25 minute visit on the following ongoing concerns:   Formulary restrictions will be an ongoing challenge for the forseable future and I would be happy to pick an alternative if the pt will first  provide me a list of them but pt  will need to return here for training for any new device that is required eg dpi vs hfa vs respimat.    In meantime we can always provide samples so the patient never runs out of any needed respiratory medications.   Advised to return with all meds in hand using a trust but verify approach to confirm accurate Medication  Reconciliation The principal here is that until we are certain that the  patients are doing what we've asked, it makes no sense to ask them to do more.   Each maintenance medication was reviewed in detail including most importantly the difference between maintenance and as needed and under what circumstances the prns are to be used.  Please see AVS for specific  Instructions which are unique to this visit and I personally typed out  which were reviewed in detail in writing with the patient and a copy provided.

## 2017-07-06 ENCOUNTER — Encounter: Payer: Self-pay | Admitting: Specialist

## 2017-07-07 ENCOUNTER — Ambulatory Visit (INDEPENDENT_AMBULATORY_CARE_PROVIDER_SITE_OTHER): Payer: Medicare Other | Admitting: Internal Medicine

## 2017-07-07 ENCOUNTER — Encounter: Payer: Self-pay | Admitting: Internal Medicine

## 2017-07-07 VITALS — BP 128/78 | HR 85 | Ht 72.0 in | Wt 184.6 lb

## 2017-07-07 DIAGNOSIS — J449 Chronic obstructive pulmonary disease, unspecified: Secondary | ICD-10-CM | POA: Diagnosis not present

## 2017-07-07 MED ORDER — BUDESONIDE-FORMOTEROL FUMARATE 160-4.5 MCG/ACT IN AERO: INHALATION_SPRAY | RESPIRATORY_TRACT | 0 refills | Status: DC

## 2017-07-07 NOTE — Patient Instructions (Signed)
Plan A = Automatic = symbicort 160 Take 2 puffs first thing in am and then another 2 puffs about 12 hours later.   Work on inhaler technique:  relax and gently blow all the way out then take a nice smooth deep breath back in, triggering the inhaler at same time you start breathing in.  Hold for up to 5 seconds if you can. Blow out thru nose. Rinse and gargle with water when done      Plan B = Backup Only use your albuterol (PROAIR) as a rescue medication to be used if you can't catch your breath by resting or doing a relaxed purse lip breathing pattern.  - The less you use it, the better it will work when you need it. - Ok to use the inhaler up to 2 puffs  every 4 hours if you must but call for appointment if use goes up over your usual need - Don't leave home without it !!  (think of it like the spare tire for your car)   Plan C = Crisis - only use your albuterol nebulizer if you first try Plan B and it fails to help > ok to use the nebulizer up to every 4 hours but if start needing it regularly call for immediate appointment    Please schedule a follow up visit in 3 months but call sooner if needed  - PFTs on return

## 2017-07-07 NOTE — Assessment & Plan Note (Signed)
Quit smoking 1994 01/20/2017  After extensive coaching HFA effectiveness =    75% from a baseline of near zero - 01/20/2017 try dulera 100 2bid and off acei   - Spirometry 03/11/2017  FEV1 0.52 (17%)  Ratio 40   - 03/11/2017    try symb 160 x 6 week free trial then ov > improved - Spirometry 04/08/2017  FEV1 1.66 (55%)  Ratio 61 p am symb 160 x 2   - 07/07/2017  After extensive coaching inhaler device  effectiveness =    75% (Ti too short)    Over reliance on sba hfa/ neb due to misunderstanding  Re how to use it and suboptimal hfa  Will optimize hfa/return for full pfts in 3 m  I had an extended discussion with the patient reviewing all relevant studies completed to date and  lasting 15 to 20 minutes of a 25 minute visit    Each maintenance medication was reviewed in detail including most importantly the difference between maintenance and prns and under what circumstances the prns are to be triggered using an action plan format that is not reflected in the computer generated alphabetically organized AVS.    Please see AVS for specific instructions unique to this visit that I personally wrote and verbalized to the the pt in detail and then reviewed with pt  by my nurse highlighting any  changes in therapy recommended at today's visit to their plan of care.

## 2017-07-07 NOTE — Progress Notes (Signed)
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 Pulmonary Consult    Referred by [name]. Pt c/o SOB off and on for the past year. He states "I get tired real quick".   no trouble at rest sitting/ sometimes a little sob immediately on lying down like getting choked  Walks dog qod s sob / not regularly  In ER x 4 since 08/2016 with sob / "congestion" with white mucus  rec Stop lisnopril /HCTZ ( prinizide ) and start losartan 50-12.5 one daily and ok to double it if needed Plan A = Automatic =  Dulera (blue) 100 = Symbicort 80 Take 2 puffs first thing in am and then another 2 puffs about 12 hours later.  Work on inhaler technique:  Plan B = Backup Only use your albuterol(Proair)  Plan C = Crisis - only use your albuterol nebulizer if you first try Plan B   Please schedule a follow up office visit in 6 weeks with pfts  call sooner if needed with all medications /inhalers/ solutions in hand so we can verify exactly what you are taking. This includes all medications from all doctors and over the counters     03/11/2017  f/u ov/Anthony Duffy re: ? GOLD IV copd / confused with meds / did not bring as req  Chief Complaint  Patient presents with  . Follow-up    Pt c/o increased SOB, wheezing and prod cough with white sputum for the past wk.  He states he feels like he is gasping for air, even wakes up in the night with this. He has been using his albuterol inhaler and neb at least 2 x per day.    Am of ov 6 h prior to OV  = neb / has dulera but it's on 0 and pt not sure when that occurred, still has sprays left and over the last week gradually worse sob  to point where sob at rest x after the neb rec duoneb x 1 treatment Prednisone 10 mg take  4 each am x 2 days,   2 each am x 2 days,  1 each am x 2 days and stop  Plan A = Automatic = Symbicort 160 Take 2 puffs first thing in am and then another 2 puffs about 12 hours later.  Work on inhaler technique:   Plan B = Backup Only use your albuterol Proair) as a rescue medication Plan C = Crisis - only use your albuterol/ipatropium nebulizer if you first try Plan B and it fails to help > ok to use the nebulizer up to every 4 hours but if start needing it regularly call for immediate appointment Please schedule a follow up office visit in 4 weeks, sooner if needed  with all medications /inhalers/ solutions in hand > did not do    04/08/2017  f/u ov/Anthony Duffy re: GOLD II vs ACOS Chief Complaint  Patient presents with  . Follow-up    Pt states he is doing good since last visit.  Denies any complaints of cough, SOB, or CP.   doing much better and now doe =  MMRC2 = can't walk a nl pace on a flat grade s sob but does fine slow and flat and little need for saba  No change in medications Work on inhaler technique:      07/07/2017  f/u ov/Anthony Duffy re:  Chief Complaint  Patient presents with  . Follow-up    Breathing is unchanged. He is using his albuterol inhaler and neb both once daily on average.   Dyspnea:  Walking around the neighborhood, slow pace, some hills  Cough: no Sleep: ok SABA use:  Once a day saba/ occ neb / poor hfa ti too short   No obvious day to day or daytime variability or assoc excess/ purulent sputum or mucus plugs or hemoptysis or cp or chest tightness, subjective wheeze or overt sinus or hb symptoms. No unusual exposure hx or h/o childhood pna/ asthma or knowledge of premature birth.  Sleeping ok flat without nocturnal  or early am exacerbation  of respiratory  c/o's or need for noct saba. Also denies any obvious fluctuation of symptoms with weather or environmental changes or other  aggravating or alleviating factors except as outlined above   Current Allergies, Complete Past Medical History, Past Surgical History, Family History, and Social History were reviewed in [employer] record.  ROS  The following are not active complaints unless bolded Hoarseness, sore throat, dysphagia, dental problems, itching, sneezing,  nasal congestion or discharge of excess mucus or purulent secretions, ear ache,   fever, chills, sweats, unintended wt loss or wt gain, classically pleuritic or exertional cp,  orthopnea pnd or leg swelling, presyncope, palpitations, abdominal pain, anorexia, nausea, vomiting, diarrhea  or change in bowel habits or change in bladder habits, change in stools or change in urine, dysuria, hematuria,  rash, arthralgias, visual complaints, headache, numbness, weakness or ataxia or problems with walking or coordination,  change in mood/affect or memory.        Current Meds  Medication Sig  . albuterol (PROVENTIL HFA;VENTOLIN HFA) 108 (90 Base) MCG/ACT inhaler Inhale 1-2 puffs into the lungs every 6 (six) hours as needed for wheezing or shortness of breath.  Marland Kitchen amLODipine (NORVASC) 10 MG tablet Take 10 mg by mouth daily.  . budesonide-formoterol (SYMBICORT) 160-4.5 MCG/ACT inhaler Take 2 puffs first thing in am and then another 2 puffs about 12 hours later.  . Cholecalciferol ([employer] VIT D 5000 HIGH-POTENCY) 5000 units capsule Take 5,000 Units by mouth daily.  . cyclobenzaprine (FLEXERIL) 10 MG tablet Take 1 tablet by mouth at bedtime as needed.  . gabapentin (NEURONTIN) 300 MG capsule 300 mg 3 (three) times daily.   Marland Kitchen ipratropium-albuterol (DUONEB) 0.5-2.5 (3) MG/3ML SOLN 1 vial in neb every 6 hours as needed  . losartan-hydrochlorothiazide (HYZAAR) 50-12.5 MG tablet Take 1 tablet by mouth daily.  . meloxicam (MOBIC) 15 MG tablet Take 15 mg by mouth daily.  . rosuvastatin (CRESTOR) 10 MG tablet Take 20 mg by mouth every evening.  . tamsulosin  (FLOMAX) 0.4 MG CAPS capsule Take 0.4 mg by mouth daily.  .   budesonide-formoterol (SYMBICORT) 160-4.5 MCG/ACT inhaler Take 2 puffs first thing in am and then another 2 puffs about 12 hours later.            Objective:   Physical Exam    amb bm nad - all smiles   07/07/2017           184  04/08/2017          180  03/11/2017  177   01/20/17 176 lb (79.8 kg)  08/08/16 165 lb (74.8 kg)  10/28/15 160 lb (72.6 kg)      Vital signs reviewed - Note on arrival 02 sats  97% on RA         HEENT: poor dentition / nl oropharynx. Nl external ear canals without cough reflex - moderate bilateral non-specific turbinate edema     NECK :  without JVD/Nodes/TM/ nl carotid upstrokes bilaterally   LUNGS: no acc muscle use,  Mod barrel  contour chest wall with bilateral  Distant bs s audible wheeze and  without cough on insp or exp maneuver and mod   Hyperresonant  to  percussion bilaterally     CV:  RRR  no s3 or murmur or increase in P2, and no edema   ABD:  soft and nontender with pos mid insp Hoover's  in the supine position. No bruits or organomegaly appreciated, bowel sounds nl  MS:   Nl gait/  ext warm without deformities, calf tenderness, cyanosis or clubbing No obvious joint restrictions   SKIN: warm and dry without lesions    NEURO:  alert, approp, nl sensorium with  no motor or cerebellar deficits apparent.                   Assessment:

## 2017-07-07 NOTE — Addendum Note (Signed)
Addended by: Vivia Ewing on: 07/07/2017 09:21 AM   Modules accepted: Orders

## 2017-09-14 ENCOUNTER — Other Ambulatory Visit (HOSPITAL_COMMUNITY): Payer: Self-pay | Admitting: Urology

## 2017-09-14 DIAGNOSIS — C61 Malignant neoplasm of prostate: Secondary | ICD-10-CM

## 2017-09-30 ENCOUNTER — Encounter (HOSPITAL_COMMUNITY)
Admission: RE | Admit: 2017-09-30 | Discharge: 2017-09-30 | Disposition: A | Discharge status: Home / Self Care | Payer: Medicare Other | Source: Ambulatory Visit | Attending: Urology | Admitting: Urology

## 2017-09-30 DIAGNOSIS — C61 Malignant neoplasm of prostate: Secondary | ICD-10-CM | POA: Insufficient documentation

## 2017-10-07 ENCOUNTER — Encounter: Payer: Self-pay | Admitting: Primary Care

## 2017-10-07 ENCOUNTER — Ambulatory Visit (INDEPENDENT_AMBULATORY_CARE_PROVIDER_SITE_OTHER): Payer: Medicare Other | Admitting: Internal Medicine

## 2017-10-07 ENCOUNTER — Ambulatory Visit (INDEPENDENT_AMBULATORY_CARE_PROVIDER_SITE_OTHER): Payer: Medicare Other | Admitting: Primary Care

## 2017-10-07 DIAGNOSIS — J449 Chronic obstructive pulmonary disease, unspecified: Secondary | ICD-10-CM | POA: Diagnosis not present

## 2017-10-07 LAB — PULMONARY FUNCTION TEST
DL/VA % PRED: 95 %
DL/VA: 4.47 ml/min/mmHg/L
DLCO COR % PRED: 67 %
DLCO COR: 23.68 ml/min/mmHg
DLCO UNC % PRED: 60 %
DLCO unc: 21.32 ml/min/mmHg
FEF 25-75 POST: 1.44 L/s
FEF 25-75 PRE: 1.22 L/s
FEF2575-%CHANGE-POST: 17 %
FEF2575-%PRED-PRE: 50 %
FEF2575-%Pred-Post: 59 %
FEV1-%Change-Post: 5 %
FEV1-%PRED-PRE: 70 %
FEV1-%Pred-Post: 74 %
FEV1-POST: 2.22 L
FEV1-Pre: 2.11 L
FEV1FVC-%CHANGE-POST: 3 %
FEV1FVC-%PRED-PRE: 89 %
FEV6-%CHANGE-POST: 2 %
FEV6-%PRED-PRE: 80 %
FEV6-%Pred-Post: 82 %
FEV6-Post: 3.16 L
FEV6-Pre: 3.09 L
FEV6FVC-%Change-Post: 0 %
FEV6FVC-%Pred-Post: 103 %
FEV6FVC-%Pred-Pre: 103 %
FVC-%Change-Post: 1 %
FVC-%PRED-PRE: 78 %
FVC-%Pred-Post: 79 %
FVC-POST: 3.21 L
FVC-PRE: 3.15 L
POST FEV1/FVC RATIO: 69 %
POST FEV6/FVC RATIO: 98 %
PRE FEV1/FVC RATIO: 67 %
Pre FEV6/FVC Ratio: 98 %
RV % pred: 111 %
RV: 3 L
TLC % pred: 83 %
TLC: 6.21 L

## 2017-10-07 NOTE — Patient Instructions (Addendum)
Follow up in 6 months with Dr. Melvyn Novas  Your pulmonary function test appears improved from your last   Continue Symbicort twice daily; as needed rescue inhaler albuterol for Wheeze/shortness of breath   If develop fever, increasing cough or shortness of breath return to office as needed

## 2017-10-07 NOTE — Progress Notes (Signed)
PFT completed today. 10/07/17  

## 2017-10-07 NOTE — Assessment & Plan Note (Signed)
-   PFTs 10/07/2017 FEV1 2.11(74%), Ratio 69, DLCO 60  - Well controlled on Symbicort 2 puffs BID, has not been requiring albuterol rescue inhaler frequently   - Reviewed HFA effectiveness with patient today, remains at 75% effectiveness ( Ti too short). Reinforced breathing technique, show deep breath making sure to hold for 10 seconds.   - Follow up in 6 months with Dr. Melvyn Novas

## 2017-10-07 NOTE — Progress Notes (Signed)
@Patient  ID: Anthony Duffy, male    DOB: [birth year], 77 y.o.   MRN: [id number]  Chief Complaint  Patient presents with  . Follow-up    PFT today, pt reports that his breathing has been pretty good, occasional SOB and fatigue with activity    Referring provider: No ref. provider found  HPI:  77 year old male with hx GOLD COPD II verse ACOS, former smoker quit 1994. Last saw Dr. Melvyn Novas on 07/07/17.  During that visit he was doing well, breathing was unchanged. Reviewed HFA, 75%  effectiveness (TI too short). Plan for full PFTs.  07/07/2017  f/u ov/Wert re:  Chief Complaint  Patient presents with  . Follow-up    Breathing is unchanged. He is using his albuterol inhaler and neb both once daily on average.   Dyspnea:  Walking around the neighborhood, slow pace, some hills  Cough: no Sleep: ok SABA use:  Once a day saba/ occ neb / poor hfa ti too short   No obvious day to day or daytime variability or assoc excess/ purulent sputum or mucus plugs or hemoptysis or cp or chest tightness, subjective wheeze or overt sinus or hb symptoms. No unusual exposure hx or h/o childhood pna/ asthma or knowledge of premature birth. Sleeping ok flat without nocturnal  or early am exacerbation  of respiratory  c/o's or need for noct saba. Also denies any obvious fluctuation of symptoms with weather or environmental changes or other aggravating or alleviating factors except as outlined above    Quit smoking 1994 - 01/20/2017 try dulera 100 2bid and off acei   - Spirometry 03/11/2017  FEV1 0.52 (17%)  Ratio 40   - 03/11/2017    try symb 160 x 6 week free trial then ov > improved - Spirometry 04/08/2017  FEV1 1.66 (55%)  Ratio 61 p am symb 160 x 2  - 07/07/2017  After extensive coaching inhaler device  effectiveness =    75% (Ti too short)   10/07/2017  Patient is doing well today, no acute complaints. States that his breathing has been pretty good. He does fatigue easily but remains active. Rare cough. No sob today at  rest. 02 sat 97% RA. Continues Symbicort, pateint is compliant and states that he takes 2 puffs twice daily. He does not regularly require rescue inhaler. He does carry Albuterol HFA with him at all times. Reports that his sleep is pretty good, states that he is restless at times. He does not wake up sob and does not require inhaler at night. No dry mouth. Completed full PFTs today.  FEV1 2.11, ratio 69, DLCO 60. Denies fever, nasal congestion, sob at rest, chest pain.     Current Allergies, Complete Past Medical History, Past Surgical History, Family History, and Social History were reviewed in [employer] record. No Known Allergies  Immunization History  Administered Date(s) Administered  . Influenza, High Dose Seasonal PF 02/06/2017    Past Medical History:  Diagnosis Date  . Arthritis   . Asthma    as child  . Bronchitis   . Hypertension   . Prostate cancer ([place]) 10/14/2014    Tobacco History: Social History   Tobacco Use  Smoking Status Former Smoker  . Packs/day: 0.50  . Years: 30.00  . Pack years: 15.00  . Last attempt to quit: 04/05/1992  . Years since quitting: 25.5  Smokeless Tobacco Never Used   Counseling given: Not Answered   Outpatient Medications Prior to Visit  Medication Sig Dispense Refill  . albuterol (PROVENTIL HFA;VENTOLIN HFA) 108 (90 Base) MCG/ACT inhaler Inhale 1-2 puffs into the lungs every 6 (six) hours as needed for wheezing or shortness of breath. 1 Inhaler 0  . amLODipine (NORVASC) 10 MG tablet Take 10 mg by mouth daily.    . budesonide-formoterol (SYMBICORT) 160-4.5 MCG/ACT inhaler Take 2 puffs first thing in am and then another 2 puffs about 12 hours later. 1 Inhaler 0  . Cholecalciferol (CVS VIT D 5000 HIGH-POTENCY) 5000 units capsule Take 5,000 Units by mouth daily.    . cyclobenzaprine (FLEXERIL) 10 MG tablet Take 1 tablet by mouth at bedtime as needed.  3  . gabapentin (NEURONTIN) 300 MG capsule 300 mg 3 (three) times  daily.     Marland Kitchen ipratropium-albuterol (DUONEB) 0.5-2.5 (3) MG/3ML SOLN 1 vial in neb every 6 hours as needed  0  . losartan-hydrochlorothiazide (HYZAAR) 50-12.5 MG tablet Take 1 tablet by mouth daily. 30 tablet 11  . meloxicam (MOBIC) 15 MG tablet Take 15 mg by mouth daily.  4  . rosuvastatin (CRESTOR) 10 MG tablet Take 20 mg by mouth every evening.  2  . tamsulosin (FLOMAX) 0.4 MG CAPS capsule Take 0.4 mg by mouth daily.  2   No facility-administered medications prior to visit.     Review of Systems  Review of Systems  Constitutional: Positive for fatigue. Negative for activity change, appetite change, chills, diaphoresis and fever.  HENT: Negative for congestion, ear pain, mouth sores, postnasal drip, rhinorrhea, sneezing and sore throat.   Respiratory: Negative for apnea, cough, chest tightness, shortness of breath and wheezing.   Cardiovascular: Negative.  Negative for chest pain.  Gastrointestinal: Negative.   Allergic/Immunologic: Negative.   Neurological: Negative.      Physical Exam  BP 124/68 (BP Location: Right Arm, Cuff Size: Normal)   Pulse 78   Ht 6' (1.829 m)   Wt 190 lb 9.6 oz (86.5 kg)   SpO2 97%   BMI 25.85 kg/m  Physical Exam  Constitutional: He is oriented to person, place, and time. He appears well-developed and well-nourished.  HENT:  Head: Normocephalic and atraumatic.  Mouth/Throat: Oropharynx is clear and moist. Normal dentition.  Eyes: Pupils are equal, round, and reactive to light. EOM are normal.  Neck: Normal range of motion. Neck supple.  Cardiovascular: Normal rate and regular rhythm.  Pulmonary/Chest: Effort normal and breath sounds normal. No respiratory distress. He has no wheezes. He has no rales.  Abdominal: Soft.  Neurological: He is alert and oriented to person, place, and time.  Skin: Skin is warm and dry.     Lab Results:  BNP No results found for: BNP  ProBNP No results found for: PROBNP  Imaging: Nm Bone Scan Whole  Body  Result Date: 10/03/2017 CLINICAL DATA:  Prostate malignancy with bilateral hip pain for the past 2-3 months with no history of fall or other trauma. EXAM: NUCLEAR MEDICINE WHOLE BODY BONE SCAN TECHNIQUE: Whole body anterior and posterior images were obtained approximately 3 hours after intravenous injection of radiopharmaceutical. RADIOPHARMACEUTICALS:  20.6 mCi Technetium-68m MDP IV COMPARISON:  Nuclear bone scan dated October 30, 2014 FINDINGS: There is adequate uptake of the radiopharmaceutical by the skeleton. Adequate soft tissue clearance and renal activity is visible. Uptake associated with the lateral aspect of the left orbit is no longer evident. There remains increased uptake at approximately T8 or T9 and and approximately L2 or L3. No abnormal rib uptake is observed. There is mildly increased uptake about  both wrists which is stable. Mildly increased uptake associated with the right hip is noted but is less prominent than on the previous study. There is increased uptake associated with the great toes bilaterally greatest on the right which is stable. IMPRESSION: Stable increased uptake in the midthoracic and mid lumbar spines. Decreased conspicuity of activity associated with the right hip. Other areas of increased uptake as described compatible with degenerative change. No findings suspicious for progressive metastatic disease. Electronically Signed   By: David  Martinique M.D.   On: 10/03/2017 07:33     Assessment & Plan:   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 - PFTs 10/07/2017 FEV1 2.11(74%), Ratio 69, DLCO 60  - Well controlled on Symbicort 2 puffs BID, has not been requiring albuterol rescue inhaler frequently   - Reviewed HFA effectiveness with patient today, remains at 75% effectiveness ( Ti too short). Reinforced breathing technique, show deep breath making sure to hold for 10 seconds.   - Follow up in 6 months with Dr. Alethia Berthold, NP 10/07/2017

## 2017-10-07 NOTE — Progress Notes (Signed)
Chart and office note reviewed in detail as well as pfts > agree with a/p as outlined

## 2017-11-01 ENCOUNTER — Encounter (HOSPITAL_COMMUNITY): Payer: Self-pay | Admitting: Emergency Medicine

## 2017-11-01 ENCOUNTER — Ambulatory Visit (HOSPITAL_COMMUNITY)
Admission: EM | Admit: 2017-11-01 | Discharge: 2017-11-01 | Disposition: A | Discharge status: Home / Self Care | Payer: Medicare Other | Attending: Internal Medicine | Admitting: Internal Medicine

## 2017-11-01 DIAGNOSIS — S0501XA Injury of conjunctiva and corneal abrasion without foreign body, right eye, initial encounter: Secondary | ICD-10-CM

## 2017-11-01 MED ORDER — TETRACAINE HCL 0.5 % OP SOLN
OPHTHALMIC | Status: AC
  Filled 2017-11-01: qty 4

## 2017-11-01 MED ORDER — FLUORESCEIN SODIUM 1 MG OP STRP
ORAL_STRIP | OPHTHALMIC | Status: AC
  Filled 2017-11-01: qty 1

## 2017-11-01 MED ORDER — OFLOXACIN 0.3 % OP SOLN: OPHTHALMIC | 0 refills | Status: DC

## 2017-11-01 NOTE — ED Provider Notes (Signed)
[facility]    CSN: [id number] Arrival date & time: 11/01/17  1117     History   Chief Complaint Chief Complaint  Patient presents with  . Eye Pain    HPI Anthony Duffy is a 77 y.o. male.   77 year old male comes in for right eye pain after possible foreign body 4 days ago. States felt something fly in to the eye while weed eating.  He has had generalized blurry vision, eye watering for the past 4 days.  States had eye redness for the first 1 to 2 days, resolved after Visine use.  Mild photophobia.  States more foreign body sensation/irritation then pain.  Denies contact lens use, does wear glasses.  States when symptoms first started, used eye rinse to wash out the eye.  He denies headache, nausea, vomiting.     Past Medical History:  Diagnosis Date  . Arthritis   . Asthma    as child  . Bronchitis   . Hypertension   . Prostate cancer ([place]) 10/14/2014    Patient Active Problem List   Diagnosis Date Noted  . 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 01/20/2017  . Essential hypertension 01/20/2017  . Malignant neoplasm of prostate ([place]) 12/03/2014    Past Surgical History:  Procedure Laterality Date  . CARDIAC SURGERY    . CATARACT EXTRACTION W/PHACO  01/05/2012   Procedure: CATARACT EXTRACTION PHACO AND INTRAOCULAR LENS PLACEMENT (IOC);  Surgeon: Adonis Brook, MD;  Location: [facility];  Service: Ophthalmology;  Laterality: Right;  . FINGER SURGERY     s/p injury 30 years ago  . PROSTATE BIOPSY  10/14/2014       Home Medications    Prior to Admission medications   Medication Sig Start Date End Date Taking? Authorizing Provider  albuterol (PROVENTIL HFA;VENTOLIN HFA) 108 (90 Base) MCG/ACT inhaler Inhale 1-2 puffs into the lungs every 6 (six) hours as needed for wheezing or shortness of breath. 11/09/16   Barnet Glasgow, NP  amLODipine (NORVASC) 10 MG tablet Take 10 mg by mouth daily.    [provider]  budesonide-formoterol (SYMBICORT) 160-4.5  MCG/ACT inhaler Take 2 puffs first thing in am and then another 2 puffs about 12 hours later. 07/07/17   Tanda Rockers, MD  Cholecalciferol (CVS VIT D 5000 HIGH-POTENCY) 5000 units capsule Take 5,000 Units by mouth daily.    [provider]  cyclobenzaprine (FLEXERIL) 10 MG tablet Take 1 tablet by mouth at bedtime as needed. 06/10/17   [provider]  gabapentin (NEURONTIN) 300 MG capsule 300 mg 3 (three) times daily.  04/22/15   [provider]  ipratropium-albuterol (DUONEB) 0.5-2.5 (3) MG/3ML SOLN 1 vial in neb every 6 hours as needed 11/22/16   [provider]  losartan-hydrochlorothiazide (HYZAAR) 50-12.5 MG tablet Take 1 tablet by mouth daily. 01/20/17   Tanda Rockers, MD  meloxicam (MOBIC) 15 MG tablet Take 15 mg by mouth daily. 05/13/17   [provider]  ofloxacin (OCUFLOX) 0.3 % ophthalmic solution 1 drop in right eye every 4 hours for the first 2 days, 4 times a day for the next 5 days 11/01/17   Afsheen Antony V, PA-C  rosuvastatin (CRESTOR) 10 MG tablet Take 20 mg by mouth every evening. 05/09/17   [provider]  tamsulosin (FLOMAX) 0.4 MG CAPS capsule Take 0.4 mg by mouth daily. 05/27/15   [provider]    Family History Family History  Problem Relation Age of  Onset  . Cancer Mother        unknown  . Cancer Cousin        unknown    Social History Social History   Tobacco Use  . Smoking status: Former Smoker    Packs/day: 0.50    Years: 30.00    Pack years: 15.00    Last attempt to quit: 04/05/1992    Years since quitting: 25.5  . Smokeless tobacco: Never Used  Substance Use Topics  . Alcohol use: No    Comment: stopped 15 years ago  . Drug use: No     Allergies   Patient has no known allergies.   Review of Systems Review of Systems  Reason unable to perform ROS: See HPI as above.     Physical Exam Triage Vital Signs ED Triage Vitals [11/01/17 1141]  Enc Vitals Group     BP (!) 149/83     Pulse Rate  80     Resp 18     Temp 98 F (36.7 C)     Temp Source Oral     SpO2 96 %     Weight      Height      Head Circumference      Peak Flow      Pain Score      Pain Loc      Pain Edu?      Excl. in [place]?    No data found.  Updated Vital Signs BP (!) 149/83 (BP Location: Left Arm)   Pulse 80   Temp 98 F (36.7 C) (Oral)   Resp 18   SpO2 96%   Visual Acuity (without glasses) Right Eye Distance: can not see top of chart, count fingers 4 ft Left Eye Distance: 20/40 Bilateral Distance:    Right Eye Near:   Left Eye Near:    Bilateral Near:     Physical Exam  Constitutional: He is oriented to person, place, and time. He appears well-developed and well-nourished. No distress.  HENT:  Head: Normocephalic and atraumatic.  Eyes: Pupils are equal, round, and reactive to light. EOM and lids are normal. Lids are everted and swept, no foreign bodies found.  Mild conjunctival injection of the right eye, no ciliary flush. White defect at 3 o'clock region directed adjacent to the pupil, seen also on fluorescein stain.  IOP (Tono-Pen) 13, 14, 14 OD  See picture below.  Around 0.3 x 0.3cm round abrasion on top of the cornea. 0.1 x 0.2 abrasion adjacent to the pupil at 3 o'clock region.  Neck: Normal range of motion. Neck supple.  Neurological: He is alert and oriented to person, place, and time.  Skin: Skin is warm and dry.         UC Treatments / Results  Labs (all labs ordered are listed, but only abnormal results are displayed) Labs Reviewed - No data to display  EKG None  Radiology No results found.  Procedures Procedures (including critical care time)  Medications Ordered in UC Medications - No data to display  Initial Impression / Assessment and Plan / UC Course  I have reviewed the triage vital signs and the nursing notes.  Pertinent labs & imaging results that were available during my care of the patient were reviewed by me and considered in my medical  decision making (see chart for details).    Start ofloxacin as directed.  Patient to follow-up with ophthalmologist tomorrow for further evaluation and monitoring of corneal  abrasion.  Resources provided.  Return precautions given.  Patient expresses understanding and agrees to plan.  Final Clinical Impressions(s) / UC Diagnoses   Final diagnoses:  Abrasion of right cornea, initial encounter    ED Prescriptions    Medication Sig Dispense Auth. Provider   ofloxacin (OCUFLOX) 0.3 % ophthalmic solution 1 drop in right eye every 4 hours for the first 2 days, 4 times a day for the next 5 days 5 mL Tobin Chad, [place] 11/01/17 1223

## 2017-11-01 NOTE — Discharge Instructions (Signed)
Start ofloxacin as directed. Please follow up with eye doctor tomorrow for reevaluation and monitor healing. Monitor for any worsening of symptoms, changes in vision, sensitivity to light, eye swelling, painful eye movement, follow up with ophthalmology for further evaluation.

## 2017-11-01 NOTE — ED Triage Notes (Signed)
Pt sts right eye pain since something flew into eye while weed eating on Saturday

## 2018-04-10 ENCOUNTER — Ambulatory Visit (INDEPENDENT_AMBULATORY_CARE_PROVIDER_SITE_OTHER): Payer: Medicare Other | Admitting: Internal Medicine

## 2018-04-10 ENCOUNTER — Encounter: Payer: Self-pay | Admitting: Internal Medicine

## 2018-04-10 VITALS — BP 120/70 | HR 71 | Ht 72.0 in | Wt 189.4 lb

## 2018-04-10 DIAGNOSIS — J449 Chronic obstructive pulmonary disease, unspecified: Secondary | ICD-10-CM

## 2018-04-10 NOTE — Progress Notes (Signed)
Subjective:     Patient ID: Anthony Duffy, male   DOB: 12-Jun-1940,    MRN: [id number]    Brief patient profile: 64 yowm quit smoking in 1994 referred to pulmonary clinic 01/20/2017 by Dr [facility] clinic for sob with GOLD II copd with marked reversibility documented 04/08/2017    History of Present Illness  01/20/2017 1st [facility] office visit/ Kennett Symes   Chief Complaint  Patient presents with  . Pulmonary Consult    Referred by [name]. Pt c/o SOB off and on for the past year. He states "I get tired real quick".   no trouble at rest sitting/ sometimes a little sob immediately on lying down like getting choked  Walks dog qod s sob / not regularly  In ER x 4 since 08/2016 with sob / "congestion" with white mucus  rec Stop lisnopril /HCTZ ( prinizide ) and start losartan 50-12.5 one daily and ok to double it if needed Plan A = Automatic =  Dulera (blue) 100 = Symbicort 80 Take 2 puffs first thing in am and then another 2 puffs about 12 hours later.  Work on inhaler technique:  Plan B = Backup Only use your albuterol(Proair)  Plan C = Crisis - only use your albuterol nebulizer if you first try Plan B   Please schedule a follow up office visit in 6 weeks with pfts  call sooner if needed with all medications /inhalers/ solutions in hand so we can verify exactly what you are taking. This includes all medications from all doctors and over the counters     03/11/2017  f/u ov/Kura Bethards re: ? GOLD IV copd / confused with meds / did not bring as req  Chief Complaint  Patient presents with  . Follow-up    Pt c/o increased SOB, wheezing and prod cough with white sputum for the past wk.  He states he feels like he is gasping for air, even wakes up in the night with this. He has been using his albuterol inhaler and neb at least 2 x per day.    Am of ov 6 h prior to OV  = neb / has dulera but it's on 0 and pt not sure when that occurred, still has sprays left and over the last week gradually worse sob  to point where sob at rest x after the neb rec duoneb x 1 treatment Prednisone 10 mg take  4 each am x 2 days,   2 each am x 2 days,  1 each am x 2 days and stop  Plan A = Automatic = Symbicort 160 Take 2 puffs first thing in am and then another 2 puffs about 12 hours later.  Work on inhaler technique:   Plan B = Backup Only use your albuterol Proair) as a rescue medication Plan C = Crisis - only use your albuterol/ipatropium nebulizer if you first try Plan B and it fails to help > ok to use the nebulizer up to every 4 hours but if start needing it regularly call for immediate appointment Please schedule a follow up office visit in 4 weeks, sooner if needed  with all medications /inhalers/ solutions in hand > did not do    04/08/2017  f/u ov/Galia Rahm re: GOLD II vs ACOS Chief Complaint  Patient presents with  . Follow-up    Pt states he is doing good since last visit.  Denies any complaints of cough, SOB, or CP.   doing much better and now doe =  MMRC2 = can't walk a nl pace on a flat grade s sob but does fine slow and flat and little need for saba  No change in medications Work on inhaler technique:          04/10/2018  f/u ov/Reannah Totten re: GOLD II vs ACOS  Chief Complaint  Patient presents with  . Follow-up    Breathing is "pretty good". He is using his albuterol inhaler and neb both about once per day.   Dyspnea:  MMRC1 = can walk nl pace, flat grade, can't hurry or go uphills or steps s sob   Cough: none now  Sleeping: ok flat bed one pillow  SABA use: about once a day when over does it / poor insight re whether to use hfa or neb  02: none     No obvious day to day or daytime variability or assoc excess/ purulent sputum or mucus plugs or hemoptysis or cp or chest tightness, subjective wheeze or overt sinus or hb symptoms.   Sleeping ok as above  without nocturnal  or early am exacerbation  of respiratory  c/o's or need for noct saba. Also denies any obvious fluctuation of symptoms with  weather or environmental changes or other aggravating or alleviating factors except as outlined above   No unusual exposure hx or h/o childhood pna/ asthma or knowledge of premature birth.  Current Allergies, Complete Past Medical History, Past Surgical History, Family History, and Social History were reviewed in [employer] record.  ROS  The following are not active complaints unless bolded Hoarseness, sore throat, dysphagia, dental problems, itching, sneezing,  nasal congestion or discharge of excess mucus or purulent secretions, ear ache,   fever, chills, sweats, unintended wt loss or wt gain, classically pleuritic or exertional cp,  orthopnea pnd or arm/hand swelling  or leg swelling, presyncope, palpitations, abdominal pain, anorexia, nausea, vomiting, diarrhea  or change in bowel habits or change in bladder habits, change in stools or change in urine, dysuria, hematuria,  rash, arthralgias, visual complaints, headache, numbness, weakness or ataxia or problems with walking or coordination,  change in mood or  memory.        Current Meds  Medication Sig  . albuterol (PROVENTIL HFA;VENTOLIN HFA) 108 (90 Base) MCG/ACT inhaler Inhale 1-2 puffs into the lungs every 6 (six) hours as needed for wheezing or shortness of breath.  Marland Kitchen amLODipine (NORVASC) 10 MG tablet Take 10 mg by mouth daily.  . budesonide-formoterol (SYMBICORT) 160-4.5 MCG/ACT inhaler Take 2 puffs first thing in am and then another 2 puffs about 12 hours later.  . Cholecalciferol (CVS VIT D 5000 HIGH-POTENCY) 5000 units capsule Take 5,000 Units by mouth daily.  . cyclobenzaprine (FLEXERIL) 10 MG tablet Take 1 tablet by mouth at bedtime as needed.  . gabapentin (NEURONTIN) 300 MG capsule 300 mg 3 (three) times daily.   Marland Kitchen ipratropium-albuterol (DUONEB) 0.5-2.5 (3) MG/3ML SOLN 1 vial in neb every 6 hours as needed  . losartan-hydrochlorothiazide (HYZAAR) 50-12.5 MG tablet Take 1 tablet by mouth daily.  . meloxicam  (MOBIC) 15 MG tablet Take 15 mg by mouth daily.  Marland Kitchen ofloxacin (OCUFLOX) 0.3 % ophthalmic solution 1 drop in right eye every 4 hours for the first 2 days, 4 times a day for the next 5 days  . rosuvastatin (CRESTOR) 10 MG tablet Take 20 mg by mouth every evening.  . tamsulosin (FLOMAX) 0.4 MG CAPS capsule Take 0.4 mg by mouth daily.  Objective:   Physical Exam   amb bm nad    04/10/2018           189  07/07/2017           184  04/08/2017          180  03/11/2017       177   01/20/17 176 lb (79.8 kg)  08/08/16 165 lb (74.8 kg)  10/28/15 160 lb (72.6 kg)    Vital signs reviewed - Note on arrival 02 sats  100% on RA       HEENT: Poor  Dentition with two partial plates / oropharynx. Nl external ear canals without cough reflex -  Mild bilateral non-specific turbinate edema     NECK :  without JVD/Nodes/TM/ nl carotid upstrokes bilaterally   LUNGS: no acc muscle use,  Mod barrel  contour chest wall with bilateral  Distant bs s audible wheeze and  without cough on insp or exp maneuver and mod   Hyperresonant  to  percussion bilaterally     CV:  RRR  no s3 or murmur or increase in P2, and no edema   ABD:  soft and nontender with pos late  insp Hoover's  in the supine position. No bruits or organomegaly appreciated, bowel sounds nl  MS:   Nl gait/  ext warm without deformities, calf tenderness, cyanosis or clubbing No obvious joint restrictions   SKIN: warm and dry without lesions    NEURO:  alert, approp, nl sensorium with  no motor or cerebellar deficits apparent.                         Assessment:

## 2018-04-10 NOTE — Patient Instructions (Signed)
Plan A = Automatic = symbicort 160 Take 2 puffs first thing in am and then another 2 puffs about 12 hours later.   Work on inhaler technique:  relax and gently blow all the way out then take a nice smooth deep breath back in, triggering the inhaler at same time you start breathing in.  Hold for up to 5 seconds if you can. Blow out thru nose. Rinse and gargle with water when done      Plan B = Backup Only use your albuterol (PROAIR) as a rescue medication to be used if you can't catch your breath by resting or doing a relaxed purse lip breathing pattern.  - The less you use it, the better it will work when you need it. - Ok to use the inhaler up to 2 puffs  every 4 hours if you must but call for appointment if use goes up over your usual need - Don't leave home without it !!  (think of it like the spare tire for your car)   Plan C = Crisis - only use your albuterol nebulizer if you first try Plan B and it fails to help > ok to use the nebulizer up to every 4 hours but if start needing it regularly call for immediate appointment     Please schedule a follow up visit in 12  months but call sooner if needed

## 2018-04-13 ENCOUNTER — Encounter: Payer: Self-pay | Admitting: Internal Medicine

## 2018-04-13 NOTE — Assessment & Plan Note (Signed)
Quit smoking 1994 01/20/2017  After extensive coaching HFA effectiveness =    75% from a baseline of near zero - 01/20/2017 try dulera 100 2bid and off acei   - Spirometry 03/11/2017  FEV1 0.52 (17%)  Ratio 40   - 03/11/2017    try symb 160 x 6 week free trial then ov > improved - Spirometry 04/08/2017  FEV1 1.66 (55%)  Ratio 61 p am symb 160 x 2  - 07/07/2017  After extensive coaching inhaler device  effectiveness =    75% (Ti too short)  - 10/07/2017  PFTs   FEV1 2.11(74%), Ratio 69, DLCO 60  - 04/10/2018  After extensive coaching inhaler device,  effectiveness =    75% short ti, poor trigger at onset of insp  I believe with better hfa he could eliminate much of his saba use and if not doing well on hfa we could change to breo or trelegy per his insurance   I spent extra time with pt today reviewing appropriate use of albuterol for prn use on exertion with the following points: 1) saba is for relief of sob that does not improve by walking a slower pace or resting but rather if the pt does not improve after trying this first. 2) If the pt is convinced, as many are, that saba helps recover from activity faster then it's easy to tell if this is the case by re-challenging : ie stop, take the inhaler, then p 5 minutes try the exact same activity (intensity of workload) that just caused the symptoms and see if they are substantially diminished or not after saba 3) if there is an activity that reproducibly causes the symptoms, try the saba 15 min before the activity on alternate days   If in fact the saba really does help, then fine to continue to use it prn but advised may need to look closer at the maintenance regimen being used to achieve better control of airways disease with exertion.    Also Advised:  formulary restrictions will be an ongoing challenge for the forseable future and I would be happy to pick an alternative if the pt will first  provide me a list of them -  pt  will need to return here for  training for any new device that is required eg dpi vs hfa vs respimat.    In the meantime we can always provide samples so that the patient never runs out of any needed respiratory medications.    If doing well can see yearly, if not return with formulary in hand to regroup     I had an extended discussion with the patient reviewing all relevant studies completed to date and  lasting 15 to 20 minutes of a 25 minute visit    See device teaching which extended face to face time for this visit.  Each maintenance medication was reviewed in detail including emphasizing most importantly the difference between maintenance and prns and under what circumstances the prns are to be triggered using an action plan format that is not reflected in the computer generated alphabetically organized AVS which I have not found useful in most complex patients, especially with respiratory illnesses  Please see AVS for specific instructions unique to this visit that I personally wrote and verbalized to the the pt in detail and then reviewed with pt  by my nurse highlighting any  changes in therapy recommended at today's visit to their plan of care.

## 2018-08-20 IMAGING — CR DG CHEST 2V
2 series · 2 of 2 positions shown · non-contrast
Comparison: 10/28/2015

CLINICAL DATA: Congestion and productive cough.

EXAM:
CHEST  2 VIEW

[w chest pa]
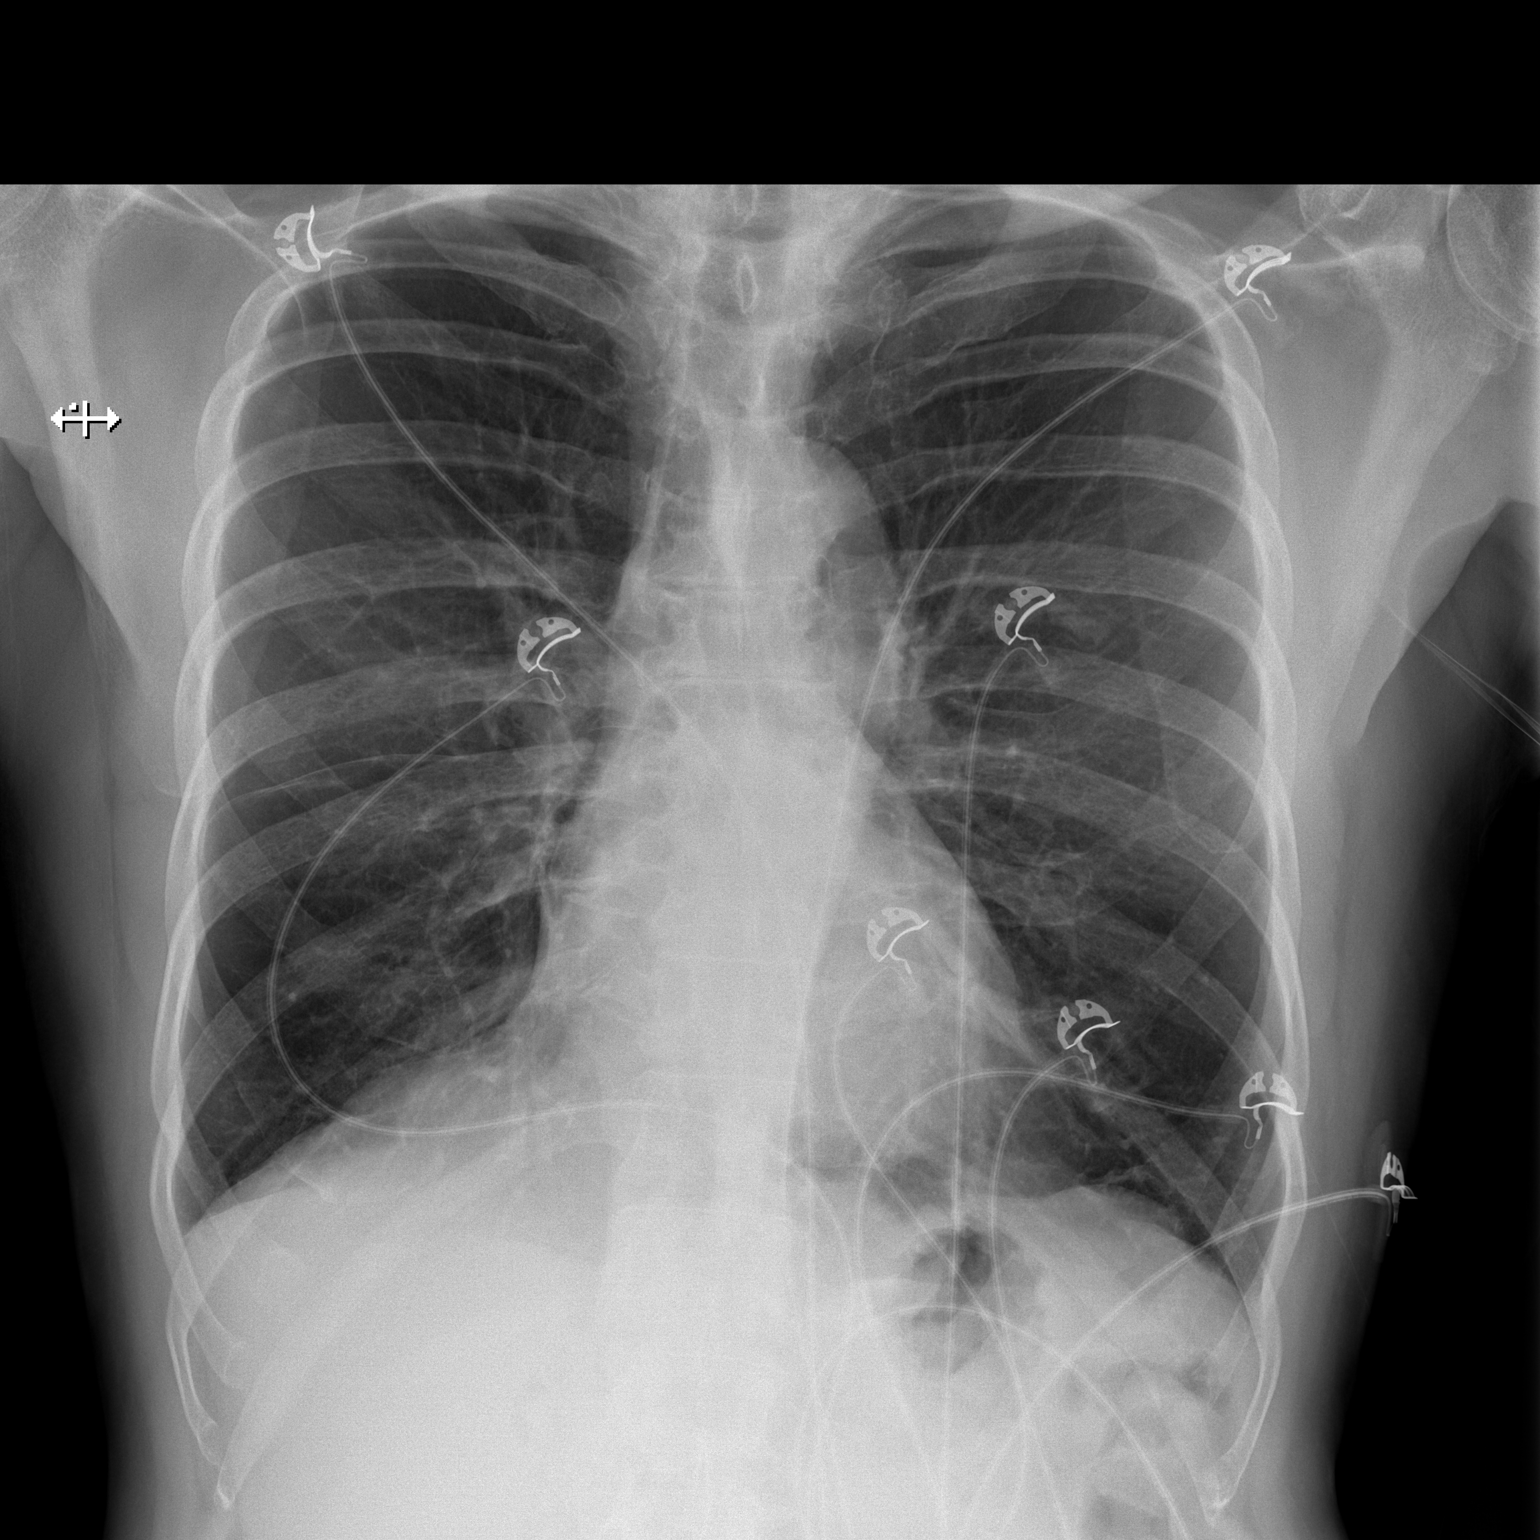

[w chest lat]
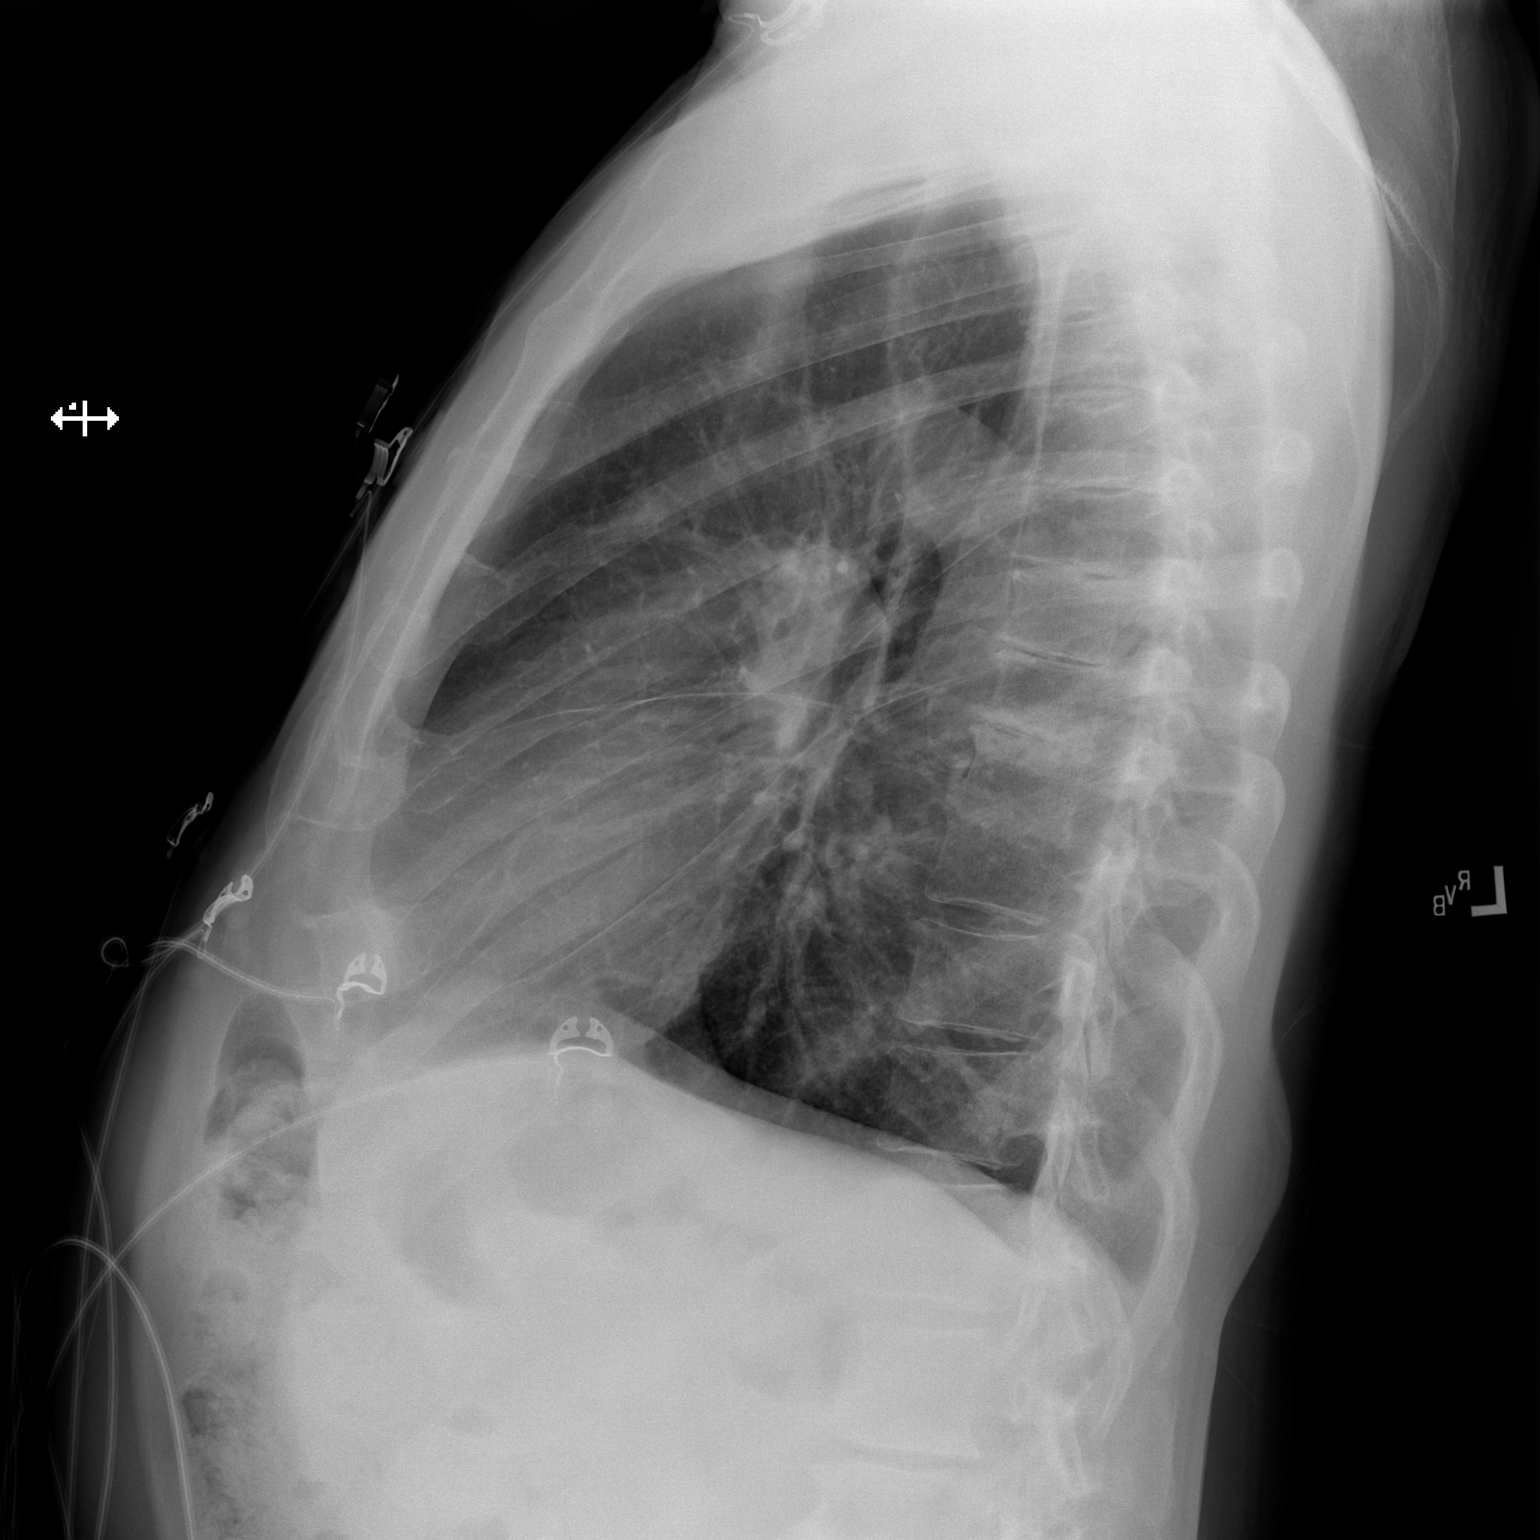

[2 of 2 positions shown; findings below may reference images not displayed]

FINDINGS: The heart size and mediastinal contours are within normal limits.
Stable appearance of emphysematous lung disease. There is no
evidence of pulmonary edema, consolidation, pneumothorax, nodule or
pleural fluid. The visualized skeletal structures are unremarkable.
IMPRESSION: No active cardiopulmonary disease.

## 2019-01-26 ENCOUNTER — Other Ambulatory Visit: Payer: Self-pay | Admitting: Cardiology

## 2019-01-26 DIAGNOSIS — I491 Atrial premature depolarization: Secondary | ICD-10-CM

## 2019-03-05 ENCOUNTER — Other Ambulatory Visit: Payer: Self-pay | Admitting: Cardiology

## 2019-03-05 DIAGNOSIS — I491 Atrial premature depolarization: Secondary | ICD-10-CM

## 2019-04-10 ENCOUNTER — Other Ambulatory Visit: Payer: Self-pay

## 2019-04-10 ENCOUNTER — Ambulatory Visit (INDEPENDENT_AMBULATORY_CARE_PROVIDER_SITE_OTHER): Payer: Medicare Other | Admitting: Internal Medicine

## 2019-04-10 ENCOUNTER — Encounter: Payer: Self-pay | Admitting: Internal Medicine

## 2019-04-10 DIAGNOSIS — J449 Chronic obstructive pulmonary disease, unspecified: Secondary | ICD-10-CM

## 2019-04-10 MED ORDER — BREZTRI AEROSPHERE 160-9-4.8 MCG/ACT IN AERO: 2.0000 | INHALATION_SPRAY | Freq: Two times a day (BID) | RESPIRATORY_TRACT | 11 refills | Status: DC

## 2019-04-10 MED ORDER — ALBUTEROL SULFATE HFA 108 (90 BASE) MCG/ACT IN AERS: 1.0000 | INHALATION_SPRAY | Freq: Four times a day (QID) | RESPIRATORY_TRACT | 11 refills | Status: DC | PRN

## 2019-04-10 NOTE — Patient Instructions (Addendum)
Plan A = Automatic = Always=    Bretri Take 2 puffs first thing in am and then another 2 puffs about 12 hours later.    Work on inhaler technique:  relax and gently blow all the way out then take a nice smooth deep breath back in, triggering the inhaler at same time you start breathing in.  Hold for up to 5 seconds if you can. Blow out thru nose. Rinse and gargle with water when done     Plan B = Backup (to supplement plan A, not to replace it) Only use your albuterol inhaler as a rescue medication to be used if you can't catch your breath by resting or doing a relaxed purse lip breathing pattern.  - The less you use it, the better it will work when you need it. - Ok to use the inhaler up to 2 puffs  every 4 hours if you must but call for appointment if use goes up over your usual need - Don't leave home without it !!  (think of it like the spare tire for your car)   Plan C = Crisis (instead of Plan B but only if Plan B stops working) - only use your albuterol nebulizer if you first try Plan B and it fails to help > ok to use the nebulizer up to every 4 hours but if start needing it regularly call for immediate appointment     Please schedule a follow up visit in 12 months but call sooner if needed

## 2019-04-10 NOTE — Progress Notes (Signed)
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 Pulmonary Consult    Referred by [name]. Pt c/o SOB off and on for the past year. He states "I get tired real quick".   no trouble at rest sitting/ sometimes a little sob immediately on lying down like getting choked  Walks dog qod s sob / not regularly  In ER x 4 since 08/2016 with sob / "congestion" with white mucus  rec Stop lisnopril /HCTZ ( prinizide ) and start losartan 50-12.5 one daily and ok to double it if needed Plan A = Automatic =  Dulera (blue) 100 = Symbicort 80 Take 2 puffs first thing in am and then another 2 puffs about 12 hours later.  Work on inhaler technique:  Plan B = Backup Only use your albuterol(Proair)  Plan C = Crisis - only use your albuterol nebulizer if you first try Plan B   Please schedule a follow up office visit in 6 weeks with pfts  call sooner if needed with all medications /inhalers/ solutions in hand so we can verify exactly what you are taking. This includes all medications from all doctors and over the counters     03/11/2017  f/u ov/[name] re: ? GOLD IV copd / confused with meds / did not bring as req  Chief Complaint  Patient presents with  . Follow-up    Pt c/o increased SOB, wheezing and prod cough with white sputum for the past wk.  He states he feels like he is gasping for air, even wakes up in the night with this. He has been using his albuterol inhaler and neb at least 2 x per day.    Am of ov 6 h prior to OV  = neb / has dulera but it's on 0 and pt not sure when that occurred, still has sprays left and over the last week gradually worse sob  to point where sob at rest x after the neb rec duoneb x 1 treatment Prednisone 10 mg take  4 each am x 2 days,   2 each am x 2 days,  1 each am x 2 days and stop  Plan A = Automatic = Symbicort 160 Take 2 puffs first thing in am and then another 2 puffs about 12 hours later.  Work on inhaler technique:   Plan B = Backup Only use your albuterol Proair) as a rescue medication Plan C = Crisis - only use your albuterol/ipatropium nebulizer if you first try Plan B and it fails to help > ok to use the nebulizer up to every 4 hours but if start needing it regularly call for immediate appointment Please schedule a follow up office visit in 4 weeks, sooner if needed  with all medications /inhalers/ solutions in hand > did not do         04/10/2018  f/u ov/[name] re: GOLD II vs ACOS  Chief Complaint  Patient presents with  . Follow-up    Breathing is "pretty good". He is using his albuterol inhaler and neb both about once per day.   Dyspnea:  MMRC1 = can walk nl pace, flat grade, can't hurry or go uphills or steps s sob   Cough: none now  Sleeping: ok flat bed one pillow  SABA use: about once a day when over does it / poor insight re whether to use hfa or neb  02: none  rec Plan A = Automatic = symbicort 160 Take 2 puffs first thing in am and then another 2 puffs about 12 hours later.  Work on inhaler technique:  Plan B = Backup Only use your albuterol (PROAIR) as a rescue medication  Plan C = Crisis - only use your albuterol nebulizer if you first try Plan B and it fails to help > ok to use the nebulizer up to every 4 hours but if start needing it regularly call for immediate appointment    04/10/2019  f/u ov/[name] re: copd II Chief Complaint  Patient presents with  . Follow-up    Pt states he has been feeling pretty good but breathing is a "little short". Pt denies any cough, wheezing, fever or chills.  Dyspnea:  MMRC1 = can walk nl pace, flat grade, can't hurry or go uphills or steps s sob    Cough: no  Sleeping: ok  SABA use: rarely now  02: none    No obvious day to day or daytime variability or assoc excess/ purulent sputum or mucus plugs or hemoptysis or cp or chest tightness, subjective wheeze or overt sinus or hb symptoms.   Sleeping flat  without nocturnal  or early am exacerbation  of respiratory  c/o's or need for noct saba. Also denies any obvious fluctuation of symptoms with weather or environmental changes or other aggravating or alleviating factors except as outlined above   No unusual exposure hx or h/o childhood pna/ asthma or knowledge of premature birth.  Current Allergies, Complete Past Medical History, Past Surgical History, Family History, and Social History were reviewed in [employer] record.  ROS  The following are not active complaints unless bolded Hoarseness, sore throat, dysphagia, dental problems, itching, sneezing,  nasal congestion or discharge of excess mucus or purulent secretions, ear ache,   fever, chills, sweats, unintended wt loss or wt gain, classically pleuritic or exertional cp,  orthopnea pnd or arm/hand swelling  or leg swelling, presyncope, palpitations, abdominal pain, anorexia, nausea, vomiting, diarrhea  or change in bowel habits or change in bladder habits, change in stools or change in urine, dysuria, hematuria,  rash, arthralgias, visual complaints, headache, numbness, weakness or ataxia or problems with walking or coordination,  change in mood or  memory.        Current Meds  Medication Sig  . albuterol (PROVENTIL HFA;VENTOLIN HFA) 108 (90 Base) MCG/ACT inhaler Inhale 1-2 puffs into the lungs every 6 (six) hours as needed for wheezing or shortness of breath.  Marland Kitchen amLODipine (NORVASC) 10 MG tablet Take 10 mg by mouth daily.  Marland Kitchen BAYER ASPIRIN PO Take 81 mg by mouth.  . budesonide-formoterol (SYMBICORT) 160-4.5 MCG/ACT inhaler Take 2 puffs first thing in am and then another 2 puffs about 12 hours later.  .  Cholecalciferol ([employer] VIT D 5000 HIGH-POTENCY) 5000 units capsule Take 5,000 Units by mouth daily.  . cyclobenzaprine (FLEXERIL) 10 MG tablet Take 1 tablet by mouth at bedtime as needed.  . gabapentin (NEURONTIN) 300 MG capsule 300 mg 3 (three) times daily.   Marland Kitchen ipratropium-albuterol (DUONEB) 0.5-2.5 (3) MG/3ML SOLN 1 vial in neb every 6 hours as needed  .  losartan-hydrochlorothiazide (HYZAAR) 50-12.5 MG tablet Take 1 tablet by mouth daily.  . meloxicam (MOBIC) 15 MG tablet Take 15 mg by mouth daily.  . metoprolol succinate (TOPROL-XL) 25 MG 24 hr tablet TAKE 1 TABLET BY MOUTH EVERY DAY  . rosuvastatin (CRESTOR) 10 MG tablet Take 20 mg by mouth every evening.  . tamsulosin (FLOMAX) 0.4 MG CAPS capsule Take 0.4 mg by mouth daily.                       Objective:   Physical Exam   amb bm nad   04/10/2019           193  04/10/2018           189  07/07/2017           184  04/08/2017          180  03/11/2017       177   01/20/17 176 lb (79.8 kg)  08/08/16 165 lb (74.8 kg)  10/28/15 160 lb (72.6 kg)     Vital signs reviewed - Note on arrival 02 sats  98% on RA        Poor  Dentition with two partial plates   HEENT : pt wearing mask not removed for exam due to covid -19 concerns.    NECK :  without JVD/Nodes/TM/ nl carotid upstrokes bilaterally   LUNGS: no acc muscle use,  Mod barrel  contour chest wall with bilateral  Distant bs  With faint late exp wheeze and  without cough on insp or exp maneuvers and mod  Hyperresonant  to  percussion bilaterally     CV:  RRR  no s3 or murmur or increase in P2, and no edema   ABD:  soft and nontender with pos mid insp Hoover's  in the supine position. No bruits or organomegaly appreciated, bowel sounds nl  MS:     ext warm without deformities, calf tenderness, cyanosis or clubbing No obvious joint restrictions   SKIN: warm and dry without lesions    NEURO:  alert, approp, nl sensorium with  no motor or cerebellar deficits apparent.               Assessment:

## 2019-04-11 ENCOUNTER — Encounter: Payer: Self-pay | Admitting: Internal Medicine

## 2019-04-11 NOTE — Assessment & Plan Note (Signed)
Quit smoking 1994 01/20/2017  After extensive coaching HFA effectiveness =    75% from a baseline of near zero - 01/20/2017 try dulera 100 2bid and off acei   - Spirometry 03/11/2017  FEV1 0.52 (17%)  Ratio 40   - 03/11/2017    try symb 160 x 6 week free trial then ov > improved - Spirometry 04/08/2017  FEV1 1.66 (55%)  Ratio 61 p am symb 160 x 2  - 07/07/2017  After extensive coaching inhaler device  effectiveness =    75% (Ti too short)  - 10/07/2017  PFTs   FEV1 2.11(74%), Ratio 69, DLCO 60 - 04/10/2019  After extensive coaching inhaler device,  effectiveness =  75%    > try breztri 2bid  Pt informed of the seriousness of COVID 19 infection as a direct risk to lung health  and safey and to close contacts and should continue to wear a facemask in public and minimize exposure to public locations but especially avoid any area or activity where non-close contacts are not observing distancing or wearing an appropriate face mask.  I strongly recommended vaccine when offered.           Each maintenance medication was reviewed in detail including emphasizing most importantly the difference between maintenance and prns and under what circumstances the prns are to be triggered using an action plan format that is not reflected in the computer generated alphabetically organized AVS which I have not found useful in most complex patients, especially with respiratory illnesses  Total time for H and P, chart review, counseling, teaching device and generating AVS / charting =  30 min

## 2019-04-12 ENCOUNTER — Ambulatory Visit: Payer: Medicare Other | Admitting: Internal Medicine

## 2019-04-30 ENCOUNTER — Ambulatory Visit: Payer: Medicare Other | Attending: Internal Medicine

## 2019-04-30 DIAGNOSIS — Z23 Encounter for immunization: Secondary | ICD-10-CM | POA: Insufficient documentation

## 2019-04-30 NOTE — Progress Notes (Signed)
   Covid-19 Vaccination Clinic  Name:  Anthony Duffy    MRN: [id number] DOB: [birth year]  04/30/2019  [name] was observed post Covid-19 immunization for 15 minutes without incidence. He was provided with Vaccine Information Sheet and instruction to access the V-Safe system.   [name] was instructed to call 911 with any severe reactions post vaccine: Marland Kitchen Difficulty breathing  . Swelling of your face and throat  . A fast heartbeat  . A bad rash all over your body  . Dizziness and weakness    Immunizations Administered    Name Date Dose VIS Date Route   Pfizer COVID-19 Vaccine 04/30/2019  9:54 AM 0.3 mL 03/16/2019 Intramuscular   Manufacturer: [place]   Lot: [id number]   [place]: [id number]

## 2019-05-21 ENCOUNTER — Ambulatory Visit: Payer: Medicare Other | Attending: Internal Medicine

## 2019-05-21 DIAGNOSIS — Z23 Encounter for immunization: Secondary | ICD-10-CM

## 2019-05-21 NOTE — Progress Notes (Signed)
   Covid-19 Vaccination Clinic  Name:  Anthony Duffy    MRN: [id number] DOB: Feb 20, 1941  05/21/2019  Anthony Duffy was observed post Covid-19 immunization for 15 minutes without incidence. He was provided with Vaccine Information Sheet and instruction to access the V-Safe system.   Anthony Duffy was instructed to call 911 with any severe reactions post vaccine: Marland Kitchen Difficulty breathing  . Swelling of your face and throat  . A fast heartbeat  . A bad rash all over your body  . Dizziness and weakness    Immunizations Administered    Name Date Dose VIS Date Route   Pfizer COVID-19 Vaccine 05/21/2019  9:31 AM 0.3 mL 03/16/2019 Intramuscular   Manufacturer: [place]   Lot: [id number]   [place]: [id number]

## 2020-04-02 ENCOUNTER — Other Ambulatory Visit: Payer: Self-pay | Admitting: Cardiology

## 2020-04-02 DIAGNOSIS — I491 Atrial premature depolarization: Secondary | ICD-10-CM

## 2020-04-10 ENCOUNTER — Ambulatory Visit (INDEPENDENT_AMBULATORY_CARE_PROVIDER_SITE_OTHER): Payer: Medicare Other | Admitting: Internal Medicine

## 2020-04-10 ENCOUNTER — Other Ambulatory Visit: Payer: Self-pay

## 2020-04-10 ENCOUNTER — Encounter: Payer: Self-pay | Admitting: Internal Medicine

## 2020-04-10 DIAGNOSIS — J449 Chronic obstructive pulmonary disease, unspecified: Secondary | ICD-10-CM

## 2020-04-10 MED ORDER — BREZTRI AEROSPHERE 160-9-4.8 MCG/ACT IN AERO: 2.0000 | INHALATION_SPRAY | Freq: Two times a day (BID) | RESPIRATORY_TRACT | 0 refills | Status: DC

## 2020-04-10 MED ORDER — ALBUTEROL SULFATE HFA 108 (90 BASE) MCG/ACT IN AERS: 1.0000 | INHALATION_SPRAY | RESPIRATORY_TRACT | 11 refills | Status: DC | PRN

## 2020-04-10 NOTE — Assessment & Plan Note (Addendum)
Quit smoking 1994 01/20/2017  After extensive coaching HFA effectiveness =    75% from a baseline of near zero - 01/20/2017 try dulera 100 2bid and off acei   - Spirometry 03/11/2017  FEV1 0.52 (17%)  Ratio 40   - 03/11/2017    try symb 160 x 6 week free trial then ov > improved - Spirometry 04/08/2017  FEV1 1.66 (55%)  Ratio 61 p am symb 160 x 2  - 07/07/2017  After extensive coaching inhaler device  effectiveness =    75% (Ti too short)  - 10/07/2017  PFTs   FEV1 2.11(74%), Ratio 69, DLCO 60 - 04/10/2019   try breztri 2bid - 04/10/2020  After extensive coaching inhaler device,  effectiveness =    75% (short Ti) rec continue breztri unless problem with urination > change to symb 160    Group D in terms of symptom/risk and laba/lama/ICS  therefore appropriate rx at this point >>> breztri or symbicort but not both  Re saba: I spent extra time with pt today reviewing appropriate use of albuterol for prn use on exertion with the following points: 1) saba is for relief of sob that does not improve by walking a slower pace or resting but rather if the pt does not improve after trying this first. 2) If the pt is convinced, as many are, that saba helps recover from activity faster then it's easy to tell if this is the case by re-challenging : ie stop, take the inhaler, then p 5 minutes try the exact same activity (intensity of workload) that just caused the symptoms and see if they are substantially diminished or not after saba 3) if there is an activity that reproducibly causes the symptoms, try the saba 15 min before the activity on alternate days   If in fact the saba really does help, then fine to continue to use it prn but advised may need to look closer at the maintenance regimen being used to achieve better control of airways disease with exertion.          Each maintenance medication was reviewed in detail including emphasizing most importantly the difference between maintenance and prns and under what  circumstances the prns are to be triggered using an action plan format where appropriate.  Total time for H and P, chart review, counseling, teaching device and generating customized AVS unique to this office visit / charting >  30 min

## 2020-04-10 NOTE — Progress Notes (Signed)
Subjective:     Patient ID: Anthony Duffy, male   DOB: Aug 23, 1940,    MRN: [id number]    Brief patient profile: 69 yobm quit smoking in 1994 referred to pulmonary clinic 01/20/2017 by Dr [facility] clinic for sob with GOLD II copd with marked reversibility documented 04/08/2017    History of Present Illness  01/20/2017 1st [facility] office visit/ Anthony Duffy   Chief Complaint  Patient presents with  . Pulmonary Consult    Referred by [name]. Pt c/o SOB off and on for the past year. He states "I get tired real quick".   no trouble at rest sitting/ sometimes a little sob immediately on lying down like getting choked  Walks dog qod s sob / not regularly  In ER x 4 since 08/2016 with sob / "congestion" with white mucus  rec Stop lisnopril /HCTZ ( prinizide ) and start losartan 50-12.5 one daily and ok to double it if needed Plan A = Automatic =  Dulera (blue) 100 = Symbicort 80 Take 2 puffs first thing in am and then another 2 puffs about 12 hours later.  Work on inhaler technique:  Plan B = Backup Only use your albuterol(Proair)  Plan C = Crisis - only use your albuterol nebulizer if you first try Plan B   Please schedule a follow up office visit in 6 weeks with pfts  call sooner if needed with all medications /inhalers/ solutions in hand so we can verify exactly what you are taking. This includes all medications from all doctors and over the counters     03/11/2017  f/u ov/Anthony Duffy re: ? GOLD IV copd / confused with meds / did not bring as req  Chief Complaint  Patient presents with  . Follow-up    Pt c/o increased SOB, wheezing and prod cough with white sputum for the past wk.  He states he feels like he is gasping for air, even wakes up in the night with this. He has been using his albuterol inhaler and neb at least 2 x per day.    Am of ov 6 h prior to OV  = neb / has dulera but it's on 0 and pt not sure when that occurred, still has sprays left and over the last week gradually worse sob  to point where sob at rest x after the neb rec duoneb x 1 treatment Prednisone 10 mg take  4 each am x 2 days,   2 each am x 2 days,  1 each am x 2 days and stop  Plan A = Automatic = Symbicort 160 Take 2 puffs first thing in am and then another 2 puffs about 12 hours later.  Work on inhaler technique:   Plan B = Backup Only use your albuterol Proair) as a rescue medication Plan C = Crisis - only use your albuterol/ipatropium nebulizer if you first try Plan B and it fails to help > ok to use the nebulizer up to every 4 hours but if start needing it regularly call for immediate appointment Please schedule a follow up office visit in 4 weeks, sooner if needed  with all medications /inhalers/ solutions in hand > did not do         04/10/2018  f/u ov/Anthony Duffy re: GOLD II vs ACOS  Chief Complaint  Patient presents with  . Follow-up    Breathing is "pretty good". He is using his albuterol inhaler and neb both about once per day.   Dyspnea:  MMRC1 = can walk nl pace, flat grade, can't hurry or go uphills or steps s sob   Cough: none now  Sleeping: ok flat bed one pillow  SABA use: about once a day when over does it / poor insight re whether to use hfa or neb  02: none  rec Plan A = Automatic = symbicort 160 Take 2 puffs first thing in am and then another 2 puffs about 12 hours later.  Work on inhaler technique:  Plan B = Backup Only use your albuterol (PROAIR) as a rescue medication  Plan C = Crisis - only use your albuterol nebulizer if you first try Plan B and it fails to help > ok to use the nebulizer up to every 4 hours but if start needing it regularly call for immediate appointment    04/10/2019  f/u ov/Anthony Duffy re: copd II Chief Complaint  Patient presents with  . Follow-up    Pt states he has been feeling pretty good but breathing is a "little short". Pt denies any cough, wheezing, fever or chills.  Dyspnea:  MMRC1 = can walk nl pace, flat grade, can't hurry or go uphills or steps s sob    Cough: no  Sleeping: ok  SABA use: rarely now  02: none  rec Plan A = Automatic = Always=    Bretri Take 2 puffs first thing in am and then another 2 puffs about 12 hours later.  Work on inhaler technique:   Plan B = Backup (to supplement plan A, not to replace it) Only use your albuterol inhaler as a rescue medication Plan C = Crisis (instead of Plan B but only if Plan B stops working) - only use your albuterol nebulizer if you first try Plan B and it fails to help > ok to use the nebulizer up to every 4 hours but if start needing it regularly call for immediate appointment    04/10/2020  f/u ov/Anthony Duffy re: GOLD II copd vs ACOS  Thoroughly confused with meds  Chief Complaint  Patient presents with  . Follow-up    Breathing is unchanged since his last visit. He is using his albuterol inhaler 3 x per day on average.   Dyspnea:  MMRC1 = can walk nl pace, flat grade, can't hurry or go uphills or steps s sob   Cough: none  Sleeping: sleep flat/one big pillow under head  SABA use: confused and using symbicort prn  02: none    No obvious day to day or daytime variability or assoc excess/ purulent sputum or mucus plugs or hemoptysis or cp or chest tightness, subjective wheeze or overt sinus or hb symptoms.   Sleeping as above  without nocturnal  or early am exacerbation  of respiratory  c/o's or need for noct saba. Also denies any obvious fluctuation of symptoms with weather or environmental changes or other aggravating or alleviating factors except as outlined above   No unusual exposure hx or h/o childhood pna/ asthma or knowledge of premature birth.  Current Allergies, Complete Past Medical History, Past Surgical History, Family History, and Social History were reviewed in [employer] record.  ROS  The following are not active complaints unless bolded Hoarseness, sore throat, dysphagia, dental problems, itching, sneezing,  nasal congestion or discharge of excess  mucus or purulent secretions, ear ache,   fever, chills, sweats, unintended wt loss or wt gain, classically pleuritic or exertional cp,  orthopnea pnd or arm/hand swelling  or leg swelling,  presyncope, palpitations, abdominal pain, anorexia, nausea, vomiting, diarrhea  or change in bowel habits or change in bladder habits, change in stools or change in urine, dysuria, hematuria,  rash, arthralgias, visual complaints, headache, numbness, weakness or ataxia or problems with walking or coordination,  change in mood or  memory.        Current Meds  Medication Sig  . albuterol (VENTOLIN HFA) 108 (90 Base) MCG/ACT inhaler Inhale 1-2 puffs into the lungs every 6 (six) hours as needed for wheezing or shortness of breath.  Marland Kitchen amLODipine (NORVASC) 10 MG tablet Take 10 mg by mouth daily.  Marland Kitchen BAYER ASPIRIN PO Take 81 mg by mouth.  . Budeson-Glycopyrrol-Formoterol (BREZTRI AEROSPHERE) 160-9-4.8 MCG/ACT AERO Inhale 2 puffs into the lungs 2 (two) times daily.  . Cholecalciferol 125 MCG (5000 UT) capsule Take 5,000 Units by mouth daily.  . cyclobenzaprine (FLEXERIL) 10 MG tablet Take 1 tablet by mouth at bedtime as needed.  . gabapentin (NEURONTIN) 300 MG capsule 300 mg 3 (three) times daily.   Marland Kitchen ipratropium-albuterol (DUONEB) 0.5-2.5 (3) MG/3ML SOLN 1 vial in neb every 6 hours as needed  . losartan-hydrochlorothiazide (HYZAAR) 50-12.5 MG tablet Take 1 tablet by mouth daily.  . meloxicam (MOBIC) 15 MG tablet Take 15 mg by mouth daily.  . metoprolol succinate (TOPROL-XL) 25 MG 24 hr tablet TAKE 1 TABLET BY MOUTH EVERY DAY  . rosuvastatin (CRESTOR) 10 MG tablet Take 20 mg by mouth every evening.  . tamsulosin (FLOMAX) 0.4 MG CAPS capsule Take 0.4 mg by mouth daily.                         Objective:   Physical Exam     04/10/2020            189 04/10/2019           193  04/10/2018           189  07/07/2017           184  04/08/2017          180  03/11/2017       177   01/20/17 176 lb (79.8 kg)  08/08/16 165  lb (74.8 kg)  10/28/15 160 lb (72.6 kg)    Vital signs reviewed  04/10/2020  - Note at rest 02 sats  97% on RA   General appearance:    amb bm nad    Poor  Dentition with two partial plates   HEENT : pt wearing mask not removed for exam due to covid -19 concerns.    NECK :  without JVD/Nodes/TM/ nl carotid upstrokes bilaterally   LUNGS: no acc muscle use,  Mod barrel  contour chest wall with bilateral  Distant bs s audible wheeze and  without cough on insp or exp maneuvers and mod  Hyperresonant  to  percussion bilaterally     CV:  RRR  no s3 or murmur or increase in P2, and no edema   ABD:  soft and nontender with pos mid insp Hoover's  in the supine position. No bruits or organomegaly appreciated, bowel sounds nl  MS:     ext warm without deformities, calf tenderness, cyanosis or clubbing No obvious joint restrictions   SKIN: warm and dry without lesions    NEURO:  alert, approp, nl sensorium with  no motor or cerebellar deficits apparent.                Assessment:

## 2020-04-10 NOTE — Patient Instructions (Addendum)
Plan A = Automatic = Always=    Breztri (or Symbicort 80 but not both) Take 2 puffs first thing in am and then another 2 puffs about 12 hours later.   Work on inhaler technique:  relax and gently blow all the way out then take a nice smooth deep breath back in, triggering the inhaler at same time you start breathing in.  Hold for up to 5 seconds if you can. Blow out thru nose. Rinse and gargle with water when done  If having trouble with peeing change to Symbicort (RED)      Plan B = Backup (to supplement plan A, not to replace it) Only use your Albuterol inhaler as a rescue medication to be used if you can't catch your breath by resting or doing a relaxed purse lip breathing pattern.  - The less you use it, the better it will work when you need it. - Ok to use the inhaler up to 2 puffs  every 4 hours if you must but call for appointment if use goes up over your usual need - Don't leave home without it !!  (think of it like the spare tire for your car)   Plan C = Crisis (instead of Plan B but only if Plan B stops working) - only use your albuterol nebulizer if you first try Plan B and it fails to help > ok to use the nebulizer up to every 4 hours but if start needing it regularly call for immediate appointment    Please schedule a follow up visit in 3 months but call sooner if needed  with all medications /inhalers/ solutions in hand so we can verify exactly what you are taking. This includes all medications from all doctors and over the counters

## 2020-06-16 ENCOUNTER — Other Ambulatory Visit: Payer: Self-pay | Admitting: Cardiology

## 2020-06-16 DIAGNOSIS — I491 Atrial premature depolarization: Secondary | ICD-10-CM

## 2020-06-19 ENCOUNTER — Encounter: Payer: Self-pay | Admitting: Sports Medicine

## 2020-06-19 ENCOUNTER — Other Ambulatory Visit: Payer: Self-pay

## 2020-06-19 ENCOUNTER — Ambulatory Visit (INDEPENDENT_AMBULATORY_CARE_PROVIDER_SITE_OTHER): Payer: Medicare Other | Admitting: Sports Medicine

## 2020-06-19 DIAGNOSIS — M2041 Other hammer toe(s) (acquired), right foot: Secondary | ICD-10-CM

## 2020-06-19 DIAGNOSIS — G35 Multiple sclerosis: Secondary | ICD-10-CM | POA: Diagnosis not present

## 2020-06-19 DIAGNOSIS — B351 Tinea unguium: Secondary | ICD-10-CM

## 2020-06-19 DIAGNOSIS — M2042 Other hammer toe(s) (acquired), left foot: Secondary | ICD-10-CM | POA: Diagnosis not present

## 2020-06-19 DIAGNOSIS — M21619 Bunion of unspecified foot: Secondary | ICD-10-CM

## 2020-06-19 DIAGNOSIS — M79674 Pain in right toe(s): Secondary | ICD-10-CM

## 2020-06-19 DIAGNOSIS — M79675 Pain in left toe(s): Secondary | ICD-10-CM | POA: Diagnosis not present

## 2020-06-19 NOTE — Progress Notes (Signed)
Subjective: WARREN LINDAHL is a 80 y.o. male patient seen today in office with complaint of mildly painful thickened and elongated toenails; unable to trim. Patient denies history of Diabetes, Neuropathy, or Vascular disease.  Reports that he does have MS.  Reports that he does have some pain here in the area over his bunions when he wears the wrong shoes.  Patient has no other pedal complaints at this time.   Review of systems noncontributory.  Patient Active Problem List   Diagnosis Date Noted  . 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 01/20/2017  . Essential hypertension 01/20/2017  . Multiple sclerosis ([place]) 02/19/2015  . Malignant neoplasm of prostate ([place]) 12/03/2014    Current Outpatient Medications on File Prior to Visit  Medication Sig Dispense Refill  . Accu-Chek FastClix Lancets MISC BLOOD GLUCOSE TESTING FASTING ALTERNATING WITH TWO HOURS AFTER SUPPER    . albuterol (VENTOLIN HFA) 108 (90 Base) MCG/ACT inhaler Inhale 1-2 puffs into the lungs every 4 (four) hours as needed for wheezing or shortness of breath. 18 g 11  . amLODipine (NORVASC) 10 MG tablet Take 10 mg by mouth daily.    Marland Kitchen BAYER ASPIRIN PO Take 81 mg by mouth.    . Budeson-Glycopyrrol-Formoterol (BREZTRI AEROSPHERE) 160-9-4.8 MCG/ACT AERO Inhale 2 puffs into the lungs 2 (two) times daily. 10.7 g 11  . Budeson-Glycopyrrol-Formoterol (BREZTRI AEROSPHERE) 160-9-4.8 MCG/ACT AERO Inhale 2 puffs into the lungs 2 (two) times daily. 5.9 g 0  . Cholecalciferol 125 MCG (5000 UT) capsule Take 5,000 Units by mouth daily.    . cyclobenzaprine (FLEXERIL) 10 MG tablet Take 1 tablet by mouth at bedtime as needed.  3  . gabapentin (NEURONTIN) 300 MG capsule 300 mg 3 (three) times daily.     Marland Kitchen ipratropium-albuterol (DUONEB) 0.5-2.5 (3) MG/3ML SOLN 1 vial in neb every 6 hours as needed  0  . losartan-hydrochlorothiazide (HYZAAR) 50-12.5 MG tablet Take 1 tablet by mouth daily. 30 tablet 11  . SYMBICORT 160-4.5 MCG/ACT inhaler Inhale  into the lungs.    . tamsulosin (FLOMAX) 0.4 MG CAPS capsule Take 0.4 mg by mouth daily.  2  . traMADol (ULTRAM) 50 MG tablet Take 1-2 tablets by mouth every 8 (eight) hours as needed.    . meloxicam (MOBIC) 15 MG tablet Take 15 mg by mouth daily. (Patient not taking: Reported on 06/19/2020)  4  . metoprolol succinate (TOPROL-XL) 25 MG 24 hr tablet TAKE 1 TABLET BY MOUTH EVERY DAY (Patient not taking: Reported on 06/19/2020) 90 tablet 0  . rosuvastatin (CRESTOR) 10 MG tablet Take 20 mg by mouth every evening. (Patient not taking: Reported on 06/19/2020)  2   No current facility-administered medications on file prior to visit.    No Known Allergies  Objective: Physical Exam  General: Well developed, nourished, no acute distress, awake, alert and oriented x 3  Vascular: Dorsalis pedis artery 2/4 bilateral, Posterior tibial artery 1/4 bilateral, skin temperature warm to warm proximal to distal bilateral lower extremities, no varicosities, pedal hair present bilateral.  Neurological: Gross sensation present via light touch bilateral.   Dermatological: Skin is warm, dry, and supple bilateral, Nails 1-10 are tender, long, thick, and discolored with mild subungal debris, no webspace macerations present bilateral, no open lesions present bilateral, no callus/corns/hyperkeratotic tissue present bilateral. No signs of infection bilateral.  Musculoskeletal: Asymptomatic bunion and hammertoe boney deformities noted bilateral. Muscular strength within normal limits without painon range of motion.  There is limited first MPJ range of motion  bilateral consistent with hallux rigidus.  No pain with calf compression bilateral.  Assessment and Plan:  Problem List Items Addressed This Visit      Nervous and Auditory   Multiple sclerosis ([place])    Other Visit Diagnoses    Pain due to onychomycosis of toenails of both feet    -  Primary   Bunion       Hammer toes of both feet          -Examined patient.   -Discussed treatment options for painful mycotic nails. -Mechanically debrided and reduced mycotic nails with sterile nail nipper and dremel nail file without incident. -Dispensed tube foam for bunions -Recommend good supportive shoes daily for foot type -Patient to return in 3 months for follow up evaluation or sooner if symptoms worsen.  Landis Martins, DPM

## 2020-06-20 ENCOUNTER — Other Ambulatory Visit: Payer: Self-pay

## 2020-06-20 ENCOUNTER — Ambulatory Visit (INDEPENDENT_AMBULATORY_CARE_PROVIDER_SITE_OTHER): Payer: Medicare Other

## 2020-06-20 ENCOUNTER — Ambulatory Visit (HOSPITAL_COMMUNITY)
Admission: EM | Admit: 2020-06-20 | Discharge: 2020-06-20 | Disposition: A | Discharge status: Home / Self Care | Payer: Medicare Other | Attending: Emergency Medicine | Admitting: Emergency Medicine

## 2020-06-20 ENCOUNTER — Encounter (HOSPITAL_COMMUNITY): Payer: Self-pay

## 2020-06-20 DIAGNOSIS — R0989 Other specified symptoms and signs involving the circulatory and respiratory systems: Secondary | ICD-10-CM | POA: Diagnosis not present

## 2020-06-20 DIAGNOSIS — R059 Cough, unspecified: Secondary | ICD-10-CM | POA: Diagnosis not present

## 2020-06-20 DIAGNOSIS — R062 Wheezing: Secondary | ICD-10-CM

## 2020-06-20 DIAGNOSIS — J441 Chronic obstructive pulmonary disease with (acute) exacerbation: Secondary | ICD-10-CM | POA: Diagnosis not present

## 2020-06-20 MED ORDER — ALBUTEROL SULFATE HFA 108 (90 BASE) MCG/ACT IN AERS: 1.0000 | INHALATION_SPRAY | Freq: Four times a day (QID) | RESPIRATORY_TRACT | 0 refills | Status: DC | PRN

## 2020-06-20 MED ORDER — IPRATROPIUM BROMIDE HFA 17 MCG/ACT IN AERS: 2.0000 | INHALATION_SPRAY | RESPIRATORY_TRACT | Status: DC

## 2020-06-20 MED ORDER — FLUTICASONE PROPIONATE 50 MCG/ACT NA SUSP: 1.0000 | Freq: Every day | NASAL | 2 refills | Status: AC

## 2020-06-20 MED ORDER — IPRATROPIUM-ALBUTEROL 0.5-2.5 (3) MG/3ML IN SOLN: 3.0000 mL | Freq: Once | RESPIRATORY_TRACT | Status: DC

## 2020-06-20 MED ORDER — PREDNISONE 50 MG PO TABS: ORAL_TABLET | ORAL | 0 refills | Status: DC

## 2020-06-20 MED ORDER — ALBUTEROL SULFATE HFA 108 (90 BASE) MCG/ACT IN AERS
4.0000 | INHALATION_SPRAY | Freq: Once | RESPIRATORY_TRACT | Status: AC
  Administered 2020-06-20: 4 via RESPIRATORY_TRACT

## 2020-06-20 MED ORDER — IPRATROPIUM BROMIDE 0.02 % IN SOLN
RESPIRATORY_TRACT | Status: AC
  Filled 2020-06-20: qty 2.5

## 2020-06-20 MED ORDER — ALBUTEROL SULFATE HFA 108 (90 BASE) MCG/ACT IN AERS
INHALATION_SPRAY | RESPIRATORY_TRACT | Status: AC
  Filled 2020-06-20: qty 6.7

## 2020-06-20 MED ORDER — DOXYCYCLINE HYCLATE 100 MG PO CAPS: 100.0000 mg | ORAL_CAPSULE | Freq: Two times a day (BID) | ORAL | 0 refills | Status: AC

## 2020-06-20 MED ORDER — ATROVENT HFA 17 MCG/ACT IN AERS: 2.0000 | INHALATION_SPRAY | Freq: Four times a day (QID) | RESPIRATORY_TRACT | 12 refills | Status: DC | PRN

## 2020-06-20 NOTE — Discharge Instructions (Addendum)
You can take 2 puffs of albuterol and 2 puffs of Atrovent every 6 hours as needed for wheezing. Please take doxycycline twice a day for the next 7 days. If you feel like your symptoms are worsening, please seek care at a local emergency department. Take 1 spray of Flonase each side for the next 7 days.

## 2020-06-20 NOTE — ED Triage Notes (Addendum)
Pt in with c/o productive cough and head stuffiness that has been going on for 1 week. Also c/o sob  Pt has taking theraflu and sudafed with minimal relief  inspiratory wheezing noted

## 2020-06-20 NOTE — ED Provider Notes (Signed)
[place]  ____________________________________________  Time seen: Approximately 9:50 AM  I have reviewed the triage vital signs and the nursing notes.   HISTORY  Chief Complaint Cough, Head congestion, and Shortness of Breath   Historian Patient    HPI Anthony Duffy is a 80 y.o. male with a history of COPD, presents to the urgent care with shortness of breath, wheezing and purulent sputum production for the past week.  Patient also endorses nasal congestion and and rhinorrhea.  He denies fever and chills at home.  He denies chest pain but reports that he has had some chest tightness.  No nausea, vomiting or abdominal pain.  No recent admissions.   Past Medical History:  Diagnosis Date  . Arthritis   . Asthma    as child  . Bronchitis   . Hypertension   . Prostate cancer ([place]) 10/14/2014     Immunizations up to date:  Yes.     Past Medical History:  Diagnosis Date  . Arthritis   . Asthma    as child  . Bronchitis   . Hypertension   . Prostate cancer ([place]) 10/14/2014    Patient Active Problem List   Diagnosis Date Noted  . 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 01/20/2017  . Essential hypertension 01/20/2017  . Multiple sclerosis ([place]) 02/19/2015  . Malignant neoplasm of prostate ([place]) 12/03/2014    Past Surgical History:  Procedure Laterality Date  . CARDIAC SURGERY    . CATARACT EXTRACTION W/PHACO  01/05/2012   Procedure: CATARACT EXTRACTION PHACO AND INTRAOCULAR LENS PLACEMENT (IOC);  Surgeon: Adonis Brook, MD;  Location: [facility];  Service: Ophthalmology;  Laterality: Right;  . FINGER SURGERY     s/p injury 30 years ago  . PROSTATE BIOPSY  10/14/2014    Prior to Admission medications   Medication Sig Start Date End Date Taking? Authorizing Provider  albuterol (VENTOLIN HFA) 108 (90 Base) MCG/ACT inhaler Inhale 1-2 puffs into the lungs every 6 (six) hours as needed for wheezing or shortness of breath. 06/20/20  Yes Vallarie Mare M, PA-C   doxycycline (VIBRAMYCIN) 100 MG capsule Take 1 capsule (100 mg total) by mouth 2 (two) times daily for 7 days. 06/20/20 06/27/20 Yes Vallarie Mare M, PA-C  fluticasone (FLONASE) 50 MCG/ACT nasal spray Place 1 spray into both nostrils daily. 06/20/20  Yes Vallarie Mare M, PA-C  ipratropium (ATROVENT HFA) 17 MCG/ACT inhaler Inhale 2 puffs into the lungs every 6 (six) hours as needed for wheezing. 06/20/20  Yes Vallarie Mare M, PA-C  predniSONE (DELTASONE) 50 MG tablet Take one tablet once daily for five days. 06/20/20  Yes Vallarie Mare M, PA-C  Accu-Chek FastClix Lancets MISC BLOOD GLUCOSE TESTING FASTING ALTERNATING WITH TWO HOURS AFTER SUPPER 07/14/18   [provider]  amLODipine (NORVASC) 10 MG tablet Take 10 mg by mouth daily.    [provider]  BAYER ASPIRIN PO Take 81 mg by mouth.    [provider]  Budeson-Glycopyrrol-Formoterol (BREZTRI AEROSPHERE) 160-9-4.8 MCG/ACT AERO Inhale 2 puffs into the lungs 2 (two) times daily. 04/10/19   Tanda Rockers, MD  Budeson-Glycopyrrol-Formoterol (BREZTRI AEROSPHERE) 160-9-4.8 MCG/ACT AERO Inhale 2 puffs into the lungs 2 (two) times daily. 04/10/20   Tanda Rockers, MD  Cholecalciferol 125 MCG (5000 UT) capsule Take 5,000 Units by mouth daily.    [provider]  cyclobenzaprine (FLEXERIL) 10 MG tablet Take 1 tablet by mouth at bedtime as needed. 06/10/17   [provider]  gabapentin (NEURONTIN) 300 MG capsule 300 mg 3 (three) times daily.  04/22/15   [provider]  ipratropium-albuterol (DUONEB) 0.5-2.5 (3) MG/3ML SOLN 1 vial in neb every 6 hours as needed 11/22/16   [provider]  losartan-hydrochlorothiazide (HYZAAR) 50-12.5 MG tablet Take 1 tablet by mouth daily. 01/20/17   Tanda Rockers, MD  meloxicam (MOBIC) 15 MG tablet Take 15 mg by mouth daily. Patient not taking: Reported on 06/19/2020 05/13/17   [provider]  metoprolol succinate (TOPROL-XL) 25 MG 24 hr tablet TAKE 1 TABLET BY  MOUTH EVERY DAY Patient not taking: Reported on 06/19/2020 06/16/20   Nigel Mormon, MD  rosuvastatin (CRESTOR) 10 MG tablet Take 20 mg by mouth every evening. Patient not taking: Reported on 06/19/2020 05/09/17   [provider]  SYMBICORT 160-4.5 MCG/ACT inhaler Inhale into the lungs. 03/14/20   [provider]  tamsulosin (FLOMAX) 0.4 MG CAPS capsule Take 0.4 mg by mouth daily. 05/27/15   [provider]  traMADol (ULTRAM) 50 MG tablet Take 1-2 tablets by mouth every 8 (eight) hours as needed. 06/29/18   [provider]    Allergies Patient has no known allergies.  Family History  Problem Relation Age of Onset  . Cancer Mother        unknown  . Cancer Cousin        unknown    Social History Social History   Tobacco Use  . Smoking status: Former Smoker    Packs/day: 0.50    Years: 30.00    Pack years: 15.00    Quit date: 04/05/1992    Years since quitting: 28.2  . Smokeless tobacco: Never Used  Vaping Use  . Vaping Use: Never used  Substance Use Topics  . Alcohol use: No    Comment: stopped 15 years ago  . Drug use: No     Review of Systems  Constitutional: No fever/chills Eyes:  No discharge ENT: Patient has nasal congestion.  Respiratory: Patient has productive cough. No SOB/ use of accessory muscles to breath. Patient has wheezing.  Gastrointestinal:   No nausea, no vomiting.  No diarrhea.  No constipation. Musculoskeletal: Negative for musculoskeletal pain. Skin: Negative for rash, abrasions, lacerations, ecchymosis.    ____________________________________________   PHYSICAL EXAM:  VITAL SIGNS: ED Triage Vitals  Enc Vitals Group     BP 06/20/20 0917 (!) 142/80     Pulse Rate 06/20/20 0917 82     Resp 06/20/20 0917 20     Temp 06/20/20 0917 99 F (37.2 C)     Temp src --      SpO2 06/20/20 0917 95 %     Weight --      Height --      Head Circumference --      Peak Flow --      Pain Score 06/20/20 0915 0      Pain Loc --      Pain Edu? --      Excl. in [place]? --      Constitutional: Alert and oriented. Well appearing and in no acute distress. Eyes: Conjunctivae are normal. PERRL. EOMI. Head: Atraumatic. ENT:      Ears:       Nose: No congestion/rhinnorhea.      Mouth/Throat: Mucous membranes are moist.  Neck: No stridor.  No cervical spine tenderness to palpation. Cardiovascular: Normal rate, regular rhythm. Normal S1 and S2.  Good peripheral circulation. Respiratory: Normal respiratory effort without tachypnea or  retractions. Patient has inspiratory wheezing. Good air entry to the bases with no decreased or absent breath sounds Gastrointestinal: Bowel sounds x 4 quadrants. Soft and nontender to palpation. No guarding or rigidity. No distention. Musculoskeletal: Full range of motion to all extremities. No obvious deformities noted Neurologic:  Normal for age. No gross focal neurologic deficits are appreciated.  Skin:  Skin is warm, dry and intact. No rash noted. Psychiatric: Mood and affect are normal for age. Speech and behavior are normal.   ____________________________________________   LABS (all labs ordered are listed, but only abnormal results are displayed)  Labs Reviewed - No data to display ____________________________________________  EKG   ____________________________________________  RADIOLOGY Unk Pinto, personally viewed and evaluated these images (plain radiographs) as part of my medical decision making, as well as reviewing the written report by the radiologist.    DG Chest 2 View  Result Date: 06/20/2020 CLINICAL DATA:  80 year old male with productive cough and wheezing. Congestion for 1 week. EXAM: CHEST - 2 VIEW COMPARISON:  Chest radiographs 11/24/2016 and earlier. FINDINGS: Chronically large lung volumes. Attenuation of upper lobe bronchovascular markings raises the possibility of emphysema. No pneumothorax, pulmonary edema, pleural effusion or confluent  pulmonary opacity. Mediastinal contours remain within normal limits. Visualized tracheal air column is within normal limits. No acute osseous abnormality identified. Negative visible bowel gas pattern. IMPRESSION: Chronic pulmonary hyperinflation. No acute cardiopulmonary abnormality. Electronically Signed   By: Genevie Ann M.D.   On: 06/20/2020 10:16    ____________________________________________    PROCEDURES  Procedure(s) performed:     Procedures     Medications  ipratropium (ATROVENT HFA) inhaler 2 puff (2 puffs Inhalation Not Given 06/20/20 1032)  albuterol (VENTOLIN HFA) 108 (90 Base) MCG/ACT inhaler 4 puff (4 puffs Inhalation Given 06/20/20 1030)     ____________________________________________   INITIAL IMPRESSION / ASSESSMENT AND PLAN / ED COURSE  Pertinent labs & imaging results that were available during my care of the patient were reviewed by me and considered in my medical decision making (see chart for details).      Assessment and plan Wheezing Productive cough 80 year old male presents to the urgent care with cough productive for purulent sputum production and wheezing for the past week.  Patient was hypertensive but vital signs were otherwise reassuring.  He had inspiratory wheezing upon initial assessment.  We will administer albuterol and obtain 2 view chest x-ray and will reassess.  Patient's wheezing resolved after albuterol breathing treatments were given in the urgent care.  Patient was sent home with doxycycline, Atrovent, albuterol, prednisone and Flonase for likely COPD exacerbation.  2 view chest x-ray showed no consolidations, opacities or infiltrates.  Patient given return precautions and advised to seek care at a local emergency department if his symptoms acutely worsen.   ____________________________________________  FINAL CLINICAL IMPRESSION(S) / ED DIAGNOSES  Final diagnoses:  COPD exacerbation ([place])      NEW MEDICATIONS STARTED  DURING THIS VISIT:  ED Discharge Orders         Ordered    doxycycline (VIBRAMYCIN) 100 MG capsule  2 times daily        06/20/20 1058    albuterol (VENTOLIN HFA) 108 (90 Base) MCG/ACT inhaler  Every 6 hours PRN        06/20/20 1059    ipratropium (ATROVENT HFA) 17 MCG/ACT inhaler  Every 6 hours PRN        06/20/20 1059    fluticasone (FLONASE) 50 MCG/ACT nasal spray  Daily  06/20/20 1103    predniSONE (DELTASONE) 50 MG tablet        06/20/20 1103              This chart was dictated using voice recognition software/Dragon. Despite best efforts to proofread, errors can occur which can change the meaning. Any change was purely unintentional.     Lannie Fields, PA-C 06/20/20 1108

## 2020-07-09 ENCOUNTER — Ambulatory Visit: Payer: Medicare Other | Admitting: Internal Medicine

## 2020-07-14 ENCOUNTER — Encounter: Payer: Self-pay | Admitting: Primary Care

## 2020-07-14 ENCOUNTER — Ambulatory Visit (INDEPENDENT_AMBULATORY_CARE_PROVIDER_SITE_OTHER): Payer: Medicare Other | Admitting: Primary Care

## 2020-07-14 ENCOUNTER — Ambulatory Visit: Payer: Medicare Other | Admitting: Internal Medicine

## 2020-07-14 ENCOUNTER — Other Ambulatory Visit: Payer: Self-pay | Admitting: Primary Care

## 2020-07-14 ENCOUNTER — Other Ambulatory Visit: Payer: Self-pay

## 2020-07-14 VITALS — BP 128/72 | HR 73 | Temp 97.7°F | Ht 66.0 in | Wt 182.6 lb

## 2020-07-14 DIAGNOSIS — J449 Chronic obstructive pulmonary disease, unspecified: Secondary | ICD-10-CM

## 2020-07-14 MED ORDER — BREZTRI AEROSPHERE 160-9-4.8 MCG/ACT IN AERO: 2.0000 | INHALATION_SPRAY | Freq: Two times a day (BID) | RESPIRATORY_TRACT | 11 refills | Status: DC

## 2020-07-14 MED ORDER — GUAIFENESIN ER 600 MG PO TB12: 600.0000 mg | ORAL_TABLET | Freq: Two times a day (BID) | ORAL | 0 refills | Status: DC

## 2020-07-14 MED ORDER — ALBUTEROL SULFATE HFA 108 (90 BASE) MCG/ACT IN AERS: 1.0000 | INHALATION_SPRAY | Freq: Four times a day (QID) | RESPIRATORY_TRACT | 2 refills | Status: DC | PRN

## 2020-07-14 NOTE — Assessment & Plan Note (Addendum)
-   He is doing well today, he reports some chest congestion and intermittent wheezing. Treated for COPD exacerbation mid-March. Uses SABA 2-3 times a week max. CAT 4.  - Continue Breztri aerosphere two puffs twice daily; prn albuterol/duoneb q 6 hours - Recommend patient take mucinex 600mg  twice daily for chest congestion  - He received both pfizer vaccines, due for booster  - Follow-up in 6 months

## 2020-07-14 NOTE — Patient Instructions (Addendum)
Recommendations: - Maintenance regimen- Continue Breztri two puffs morning and evening (rinse mouth) - In care of emergency- Use Albuterol inhaler or nebulzier every 6 hours for shrotness of breath  - Take mucinex 600mg  twice a day for 1-2 weeks to help with congestion - Refills sent to pharmacy   Visit for home covid test: - https://[email or web address].[email or web address].[email or web address]  Follow-up: - 6 months with Dr. Melvyn Novas or sooner if needed     Chronic Obstructive Pulmonary Disease  Chronic obstructive pulmonary disease (COPD) is a long-term (chronic) lung problem. When you have COPD, it is hard for air to get in and out of your lungs. Usually the condition gets worse over time, and your lungs will never return to normal. There are things you can do to keep yourself as healthy as possible. What are the causes?  Smoking. This is the most common cause.  Certain genes passed from parent to child (inherited). What increases the risk?  Being exposed to secondhand smoke from cigarettes, pipes, or cigars.  Being exposed to chemicals and other irritants, such as fumes and dust in the work environment.  Having chronic lung conditions or infections. What are the signs or symptoms?  Shortness of breath, especially during physical activity.  A long-term cough with a large amount of thick mucus. Sometimes, the cough may not have any mucus (dry cough).  Wheezing.  Breathing quickly.  Skin that looks gray or blue, especially in the fingers, toes, or lips.  Feeling tired (fatigue).  Weight loss.  Chest tightness.  Having infections often.  Episodes when breathing symptoms become much worse (exacerbations). At the later stages of this disease, you may have swelling in the ankles, feet, or legs. How is this treated?  Taking medicines.  Quitting smoking, if you smoke.  Rehabilitation. This includes steps to make your body work better. It may involve a team of specialists.  Doing exercises.  Making changes  to your diet.  Using oxygen.  Lung surgery.  Lung transplant.  Comfort measures (palliative care). Follow these instructions at home: Medicines  Take over-the-counter and prescription medicines only as told by your doctor.  Talk to your doctor before taking any cough or allergy medicines. You may need to avoid medicines that cause your lungs to be dry. Lifestyle  If you smoke, stop smoking. Smoking makes the problem worse.  Do not smoke or use any products that contain nicotine or tobacco. If you need help quitting, ask your doctor.  Avoid being around things that make your breathing worse. This may include smoke, chemicals, and fumes.  Stay active, but remember to rest as well.  Learn and use tips on how to manage stress and control your breathing.  Make sure you get enough sleep. Most adults need at least 7 hours of sleep every night.  Eat healthy foods. Eat smaller meals more often. Rest before meals. Controlled breathing Learn and use tips on how to control your breathing as told by your doctor. Try:  Breathing in (inhaling) through your nose for 1 second. Then, pucker your lips and breath out (exhale) through your lips for 2 seconds.  Putting one hand on your belly (abdomen). Breathe in slowly through your nose for 1 second. Your hand on your belly should move out. Pucker your lips and breathe out slowly through your lips. Your hand on your belly should move in as you breathe out.   Controlled coughing Learn and use controlled coughing to clear mucus from your lungs. Follow these steps: 1. [employer]  your head a little forward. 2. Breathe in deeply. 3. Try to hold your breath for 3 seconds. 4. Keep your mouth slightly open while coughing 2 times. 5. Spit any mucus out into a tissue. 6. Rest and do the steps again 1 or 2 times as needed. General instructions  Make sure you get all the shots (vaccines) that your doctor recommends. Ask your doctor about a flu shot and a  pneumonia shot.  Use oxygen therapy and pulmonary rehabilitation if told by your doctor. If you need home oxygen therapy, ask your doctor if you should buy a tool to measure your oxygen level (oximeter).  Make a COPD action plan with your doctor. This helps you to know what to do if you feel worse than usual.  Manage any other conditions you have as told by your doctor.  Avoid going outside when it is very hot, cold, or humid.  Avoid people who have a sickness you can catch (contagious).  Keep all follow-up visits. Contact a doctor if:  You cough up more mucus than usual.  There is a change in the color or thickness of the mucus.  It is harder to breathe than usual.  Your breathing is faster than usual.  You have trouble sleeping.  You need to use your medicines more often than usual.  You have trouble doing your normal activities such as getting dressed or walking around the house. Get help right away if:  You have shortness of breath while resting.  You have shortness of breath that stops you from: ? Being able to talk. ? Doing normal activities.  Your chest hurts for longer than 5 minutes.  Your skin color is more blue than usual.  Your pulse oximeter shows that you have low oxygen for longer than 5 minutes.  You have a fever.  You feel too tired to breathe normally. These symptoms may represent a serious problem that is an emergency. Do not wait to see if the symptoms will go away. Get medical help right away. Call your local emergency services (911 in the [place] Do not drive yourself to the hospital. Summary  Chronic obstructive pulmonary disease (COPD) is a long-term lung problem.  The way your lungs work will never return to normal. Usually the condition gets worse over time. There are things you can do to keep yourself as healthy as possible.  Take over-the-counter and prescription medicines only as told by your doctor.  If you smoke, stop. Smoking makes  the problem worse. This information is not intended to replace advice given to you by your health care provider. Make sure you discuss any questions you have with your health care provider. Document Revised: 01/29/2020 Document Reviewed: 01/29/2020 Elsevier Patient Education  2021 [employer].

## 2020-07-14 NOTE — Progress Notes (Signed)
@Patient  ID: Anthony Duffy, male    DOB: 03-29-1941, 80 y.o.   MRN: [id number]  Chief Complaint  Patient presents with  . Follow-up    Wheezing     Referring provider: Care, Lenord Carbo*   Brief patient profile: 79 yobm quit smoking in 1994 referred to pulmonary clinic 01/20/2017 by Dr [facility] clinic for sob with GOLD II copd with marked reversibility documented 04/08/2017   HPI: 80 year old male, former smoker quit 1994 (15-pack-year history).  Medical history significant for pretension, multiple sclerosis, prostate cancer, COPD Gold 2.  Patient of Dr. Melvyn Novas, last seen in office on 04/10/2020. 10/07/2017  PFTs   FEV1 2.11(74%), Ratio 69, DLCO 60  07/14/2020 Patient presents today for 23-month follow-up/COPD. Anthony Duffy was treated for bronchitis 2-3 weeks ago at [facility], Anthony Duffy still has some residual wheezing. Cough has resolved. Anthony Duffy does have a lot of phlegm in his chest. Anthony Duffy is compliant with Breztri twice daily. Anthony Duffy uses albuterol a couple times a week. Anthony Duffy is not limited in his activities. Weight is stable. Denies f/c/s, chest tightness, hemoptysis. CAT score 4.    No Known Allergies  Immunization History  Administered Date(s) Administered  . Influenza, High Dose Seasonal PF 01/20/2017, 12/04/2018  . PFIZER(Purple Top)SARS-COV-2 Vaccination 04/30/2019, 05/21/2019    Past Medical History:  Diagnosis Date  . Arthritis   . Asthma    as child  . Bronchitis   . Hypertension   . Prostate cancer ([place]) 10/14/2014    Tobacco History: Social History   Tobacco Use  Smoking Status Former Smoker  . Packs/day: 0.50  . Years: 30.00  . Pack years: 15.00  . Quit date: 04/05/1992  . Years since quitting: 28.2  Smokeless Tobacco Never Used   Counseling given: Not Answered   Outpatient Medications Prior to Visit  Medication Sig Dispense Refill  . Accu-Chek FastClix Lancets MISC BLOOD GLUCOSE TESTING FASTING ALTERNATING WITH TWO HOURS AFTER SUPPER    . amLODipine (NORVASC) 10 MG tablet  Take 10 mg by mouth daily.    Marland Kitchen BAYER ASPIRIN PO Take 81 mg by mouth.    . Cholecalciferol 125 MCG (5000 UT) capsule Take 5,000 Units by mouth daily.    . cyclobenzaprine (FLEXERIL) 10 MG tablet Take 1 tablet by mouth at bedtime as needed.  3  . fluticasone (FLONASE) 50 MCG/ACT nasal spray Place 1 spray into both nostrils daily. 9.9 mL 2  . gabapentin (NEURONTIN) 300 MG capsule 300 mg daily.    Marland Kitchen ipratropium (ATROVENT HFA) 17 MCG/ACT inhaler Inhale 2 puffs into the lungs every 6 (six) hours as needed for wheezing. 1 each 12  . ipratropium-albuterol (DUONEB) 0.5-2.5 (3) MG/3ML SOLN 1 vial in neb every 6 hours as needed  0  . losartan-hydrochlorothiazide (HYZAAR) 50-12.5 MG tablet Take 1 tablet by mouth daily. 30 tablet 11  . meloxicam (MOBIC) 15 MG tablet Take 15 mg by mouth daily.  4  . metoprolol succinate (TOPROL-XL) 25 MG 24 hr tablet TAKE 1 TABLET BY MOUTH EVERY DAY 90 tablet 0  . rosuvastatin (CRESTOR) 10 MG tablet Take 20 mg by mouth every evening.  2  . tamsulosin (FLOMAX) 0.4 MG CAPS capsule Take 0.4 mg by mouth daily.  2  . traMADol (ULTRAM) 50 MG tablet Take 1-2 tablets by mouth every 8 (eight) hours as needed.    Marland Kitchen albuterol (VENTOLIN HFA) 108 (90 Base) MCG/ACT inhaler Inhale 1-2 puffs into the lungs every 6 (six) hours as needed for wheezing or  shortness of breath. 6.7 g 0  . Budeson-Glycopyrrol-Formoterol (BREZTRI AEROSPHERE) 160-9-4.8 MCG/ACT AERO Inhale 2 puffs into the lungs 2 (two) times daily. 10.7 g 11  . Budeson-Glycopyrrol-Formoterol (BREZTRI AEROSPHERE) 160-9-4.8 MCG/ACT AERO Inhale 2 puffs into the lungs 2 (two) times daily. 5.9 g 0  . predniSONE (DELTASONE) 50 MG tablet Take one tablet once daily for five days. 5 tablet 0  . SYMBICORT 160-4.5 MCG/ACT inhaler Inhale into the lungs.     No facility-administered medications prior to visit.    Review of Systems  Review of Systems  Constitutional: Negative.   HENT: Positive for congestion.   Respiratory: Positive for  wheezing. Negative for cough, chest tightness and shortness of breath.   Cardiovascular: Negative.    Physical Exam  BP 128/72 (BP Location: Left Arm, Cuff Size: Normal)   Pulse 73   Temp 97.7 F (36.5 C) (Temporal)   Ht 5\' 6"  (1.676 m)   Wt 182 lb 9.6 oz (82.8 kg)   SpO2 99%   BMI 29.47 kg/m  Physical Exam Constitutional:      Appearance: Normal appearance.  HENT:     Head: Normocephalic and atraumatic.     Mouth/Throat:     Mouth: Mucous membranes are moist.     Pharynx: Oropharynx is clear. No oropharyngeal exudate.  Cardiovascular:     Rate and Rhythm: Normal rate and regular rhythm.  Pulmonary:     Effort: Pulmonary effort is normal. No respiratory distress.     Breath sounds: Normal breath sounds. No wheezing, rhonchi or rales.     Comments: CTA Musculoskeletal:        General: Normal range of motion.  Skin:    General: Skin is warm.  Neurological:     General: No focal deficit present.     Mental Status: Anthony Duffy is alert and oriented to person, place, and time. Mental status is at baseline.  Psychiatric:        Mood and Affect: Mood normal.        Behavior: Behavior normal.        Thought Content: Thought content normal.        Judgment: Judgment normal.      Lab Results:  CBC    Component Value Date/Time   WBC 3.5 (L) 08/08/2016 1031   RBC 4.20 (L) 08/08/2016 1031   HGB 11.5 (L) 08/08/2016 1031   HCT 35.4 (L) 08/08/2016 1031   PLT 279 08/08/2016 1031   MCV 84.3 08/08/2016 1031   MCH 27.4 08/08/2016 1031   MCHC 32.5 08/08/2016 1031   RDW 14.8 08/08/2016 1031   LYMPHSABS 0.7 08/08/2016 1031   MONOABS 0.3 08/08/2016 1031   EOSABS 0.8 (H) 08/08/2016 1031   BASOSABS 0.0 08/08/2016 1031    BMET    Component Value Date/Time   NA 139 08/08/2016 1031   K 4.0 08/08/2016 1031   CL 105 08/08/2016 1031   CO2 28 08/08/2016 1031   GLUCOSE 106 (H) 08/08/2016 1031   BUN 14 08/08/2016 1031   CREATININE 0.84 08/08/2016 1031   CALCIUM 9.0 08/08/2016 1031    GFRNONAA >60 08/08/2016 1031   GFRAA >60 08/08/2016 1031    BNP No results found for: BNP  ProBNP No results found for: PROBNP  Imaging: DG Chest 2 View  Result Date: 06/20/2020 CLINICAL DATA:  80 year old male with productive cough and wheezing. Congestion for 1 week. EXAM: CHEST - 2 VIEW COMPARISON:  Chest radiographs 11/24/2016 and earlier. FINDINGS: Chronically large lung volumes. Attenuation  of upper lobe bronchovascular markings raises the possibility of emphysema. No pneumothorax, pulmonary edema, pleural effusion or confluent pulmonary opacity. Mediastinal contours remain within normal limits. Visualized tracheal air column is within normal limits. No acute osseous abnormality identified. Negative visible bowel gas pattern. IMPRESSION: Chronic pulmonary hyperinflation. No acute cardiopulmonary abnormality. Electronically Signed   By: Genevie Ann M.D.   On: 06/20/2020 10:16     Assessment & Plan:   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 - Anthony Duffy is doing well today, Anthony Duffy reports some chest congestion and intermittent wheezing. Treated for COPD exacerbation mid-March. Uses SABA 2-3 times a week max. CAT 4.  - Continue Breztri aerosphere two puffs twice daily; prn albuterol/duoneb q 6 hours - Recommend patient take mucinex 600mg  twice daily for chest congestion  - Anthony Duffy received both pfizer vaccines, due for booster  - Follow-up in 6 months   Martyn Ehrich, NP 07/14/2020

## 2020-09-25 ENCOUNTER — Other Ambulatory Visit: Payer: Self-pay

## 2020-09-25 ENCOUNTER — Ambulatory Visit (INDEPENDENT_AMBULATORY_CARE_PROVIDER_SITE_OTHER): Payer: Medicare Other | Admitting: Sports Medicine

## 2020-09-25 ENCOUNTER — Encounter: Payer: Self-pay | Admitting: Sports Medicine

## 2020-09-25 DIAGNOSIS — M79675 Pain in left toe(s): Secondary | ICD-10-CM

## 2020-09-25 DIAGNOSIS — B351 Tinea unguium: Secondary | ICD-10-CM

## 2020-09-25 DIAGNOSIS — M79674 Pain in right toe(s): Secondary | ICD-10-CM | POA: Diagnosis not present

## 2020-09-25 DIAGNOSIS — G35 Multiple sclerosis: Secondary | ICD-10-CM

## 2020-09-25 NOTE — Progress Notes (Signed)
Subjective:  Anthony Duffy is a 80 y.o. male patient seen today in office with complaint of mildly painful thickened and elongated toenails; unable to trim. Patient  have MS.  Denies any changes with meds or health since last encounter. Patient has no other pedal complaints at this time.    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 18 g 2   amLODipine (NORVASC) 10 MG tablet Take 10 mg by mouth daily.     BAYER ASPIRIN PO Take 81 mg by mouth.     Budeson-Glycopyrrol-Formoterol (BREZTRI AEROSPHERE) 160-9-4.8 MCG/ACT AERO Inhale 2 puffs into the lungs 2 (two) times daily. 10.7 g 11   Cholecalciferol 125 MCG (5000 UT) capsule Take 5,000 Units by mouth daily.     cyclobenzaprine (FLEXERIL) 10 MG tablet Take 1 tablet by mouth at bedtime as needed.  3   fluticasone (FLONASE) 50 MCG/ACT nasal spray Place 1 spray into both nostrils daily. 9.9 mL 2   gabapentin (NEURONTIN) 300 MG capsule 300 mg daily.     guaiFENesin (MUCINEX) 600 MG 12 hr tablet Take 1 tablet (600 mg total) by mouth 2 (two) times daily. 2 tablet 0   ipratropium (ATROVENT HFA) 17 MCG/ACT inhaler Inhale 2 puffs into the lungs every 6 (six) hours as needed for wheezing. 1 each 12   ipratropium-albuterol (DUONEB) 0.5-2.5 (3) MG/3ML SOLN 1 vial in neb every 6 hours as needed  0   loratadine (CLARITIN) 10 MG tablet Take 10 mg by mouth daily.     losartan-hydrochlorothiazide (HYZAAR) 50-12.5 MG tablet Take 1  tablet by mouth daily. 30 tablet 11   meloxicam (MOBIC) 15 MG tablet Take 15 mg by mouth daily.  4   metoprolol succinate (TOPROL-XL) 25 MG 24 hr tablet TAKE 1 TABLET BY MOUTH EVERY DAY 90 tablet 0   rosuvastatin (CRESTOR) 10 MG tablet Take 20 mg by mouth every evening.  2   tamsulosin (FLOMAX) 0.4 MG CAPS capsule Take 0.4 mg by mouth daily.  2   traMADol (ULTRAM) 50 MG tablet Take 1-2 tablets by mouth every 8 (eight) hours as needed.     No current facility-administered medications on file prior to visit.    No Known Allergies  Objective: Physical Exam  General: Well developed, nourished, no acute distress, awake, alert and oriented x 3  Vascular: Dorsalis pedis artery 2/4 bilateral, Posterior tibial artery 1/4 bilateral, skin temperature warm to warm proximal to distal bilateral lower extremities, no varicosities, pedal hair present bilateral.  Neurological: Gross sensation present via light touch bilateral.   Dermatological: Skin is warm, dry, and supple bilateral, Nails 1-10 are tender, long, thick, and discolored with mild subungal debris, no webspace macerations present bilateral, no open lesions present bilateral, no callus/corns/hyperkeratotic tissue present bilateral. No signs of infection bilateral.  Musculoskeletal: Asymptomatic bunion and hammertoe boney deformities noted bilateral. Muscular strength within normal limits without painon range of motion.  There is limited first MPJ range of  motion bilateral consistent with hallux rigidus.  No pain with calf compression bilateral.  Assessment and Plan:  Problem List Items Addressed This Visit       Nervous and Auditory   Multiple sclerosis ([place])   Other Visit Diagnoses     Pain due to onychomycosis of toenails of both feet    -  Primary       -Examined patient.  -Discussed treatment options for painful mycotic nails. -Mechanically debrided and reduced mycotic nails with sterile nail nipper and dremel nail file  without incident. -Continue with good supportive shoes daily for foot type -Patient to return in 3 months for follow up evaluation or sooner if symptoms worsen.  Landis Martins, DPM

## 2020-09-29 ENCOUNTER — Other Ambulatory Visit (HOSPITAL_COMMUNITY): Payer: Self-pay | Admitting: Student

## 2020-09-29 DIAGNOSIS — I10 Essential (primary) hypertension: Secondary | ICD-10-CM

## 2020-09-29 DIAGNOSIS — R9431 Abnormal electrocardiogram [ECG] [EKG]: Secondary | ICD-10-CM

## 2020-10-15 ENCOUNTER — Ambulatory Visit (HOSPITAL_COMMUNITY)
Admission: RE | Admit: 2020-10-15 | Discharge: 2020-10-15 | Disposition: A | Discharge status: Home / Self Care | Payer: Medicare Other | Source: Ambulatory Visit | Attending: Student | Admitting: Student

## 2020-10-15 ENCOUNTER — Other Ambulatory Visit: Payer: Self-pay

## 2020-10-15 DIAGNOSIS — I10 Essential (primary) hypertension: Secondary | ICD-10-CM | POA: Insufficient documentation

## 2020-10-15 DIAGNOSIS — R9431 Abnormal electrocardiogram [ECG] [EKG]: Secondary | ICD-10-CM

## 2020-10-15 DIAGNOSIS — I700 Atherosclerosis of aorta: Secondary | ICD-10-CM | POA: Diagnosis not present

## 2020-10-15 DIAGNOSIS — I803 Phlebitis and thrombophlebitis of lower extremities, unspecified: Secondary | ICD-10-CM | POA: Insufficient documentation

## 2020-10-15 LAB — ECHOCARDIOGRAM COMPLETE
Area-P 1/2: 2.5 cm2
S' Lateral: 2.8 cm

## 2020-11-05 ENCOUNTER — Encounter: Payer: Self-pay | Admitting: Internal Medicine

## 2020-11-09 ENCOUNTER — Other Ambulatory Visit: Payer: Self-pay

## 2020-11-09 ENCOUNTER — Encounter (HOSPITAL_COMMUNITY): Payer: Self-pay | Admitting: Emergency Medicine

## 2020-11-09 ENCOUNTER — Emergency Department (HOSPITAL_COMMUNITY)
Admission: EM | Admit: 2020-11-09 | Discharge: 2020-11-09 | Disposition: A | Discharge status: Home / Self Care | Payer: Medicare Other | Attending: Emergency Medicine | Admitting: Emergency Medicine

## 2020-11-09 ENCOUNTER — Emergency Department (HOSPITAL_COMMUNITY): Payer: Medicare Other

## 2020-11-09 DIAGNOSIS — Z7951 Long term (current) use of inhaled steroids: Secondary | ICD-10-CM | POA: Insufficient documentation

## 2020-11-09 DIAGNOSIS — J45909 Unspecified asthma, uncomplicated: Secondary | ICD-10-CM | POA: Diagnosis not present

## 2020-11-09 DIAGNOSIS — R0602 Shortness of breath: Secondary | ICD-10-CM | POA: Diagnosis present

## 2020-11-09 DIAGNOSIS — J441 Chronic obstructive pulmonary disease with (acute) exacerbation: Secondary | ICD-10-CM | POA: Diagnosis not present

## 2020-11-09 DIAGNOSIS — Z79899 Other long term (current) drug therapy: Secondary | ICD-10-CM | POA: Diagnosis not present

## 2020-11-09 DIAGNOSIS — Z20822 Contact with and (suspected) exposure to covid-19: Secondary | ICD-10-CM | POA: Insufficient documentation

## 2020-11-09 DIAGNOSIS — Z87891 Personal history of nicotine dependence: Secondary | ICD-10-CM | POA: Diagnosis not present

## 2020-11-09 DIAGNOSIS — I10 Essential (primary) hypertension: Secondary | ICD-10-CM | POA: Diagnosis not present

## 2020-11-09 DIAGNOSIS — Z8546 Personal history of malignant neoplasm of prostate: Secondary | ICD-10-CM | POA: Diagnosis not present

## 2020-11-09 LAB — CBC
HCT: 41.4 % (ref 39.0–52.0)
Hemoglobin: 13.3 g/dL (ref 13.0–17.0)
MCH: 27.5 pg (ref 26.0–34.0)
MCHC: 32.1 g/dL (ref 30.0–36.0)
MCV: 85.5 fL (ref 80.0–100.0)
Platelets: 302 10*3/uL (ref 150–400)
RBC: 4.84 MIL/uL (ref 4.22–5.81)
RDW: 14.6 % (ref 11.5–15.5)
WBC: 7.5 10*3/uL (ref 4.0–10.5)
nRBC: 0 % (ref 0.0–0.2)

## 2020-11-09 LAB — BASIC METABOLIC PANEL
Anion gap: 7 (ref 5–15)
BUN: 15 mg/dL (ref 8–23)
CO2: 30 mmol/L (ref 22–32)
Calcium: 9.2 mg/dL (ref 8.9–10.3)
Chloride: 101 mmol/L (ref 98–111)
Creatinine, Ser: 1 mg/dL (ref 0.61–1.24)
GFR, Estimated: >60 mL/min (ref >60)
Glucose, Bld: 120 mg/dL — ABNORMAL HIGH (ref 70–99)
Potassium: 4.1 mmol/L (ref 3.5–5.1)
Sodium: 138 mmol/L (ref 135–145)

## 2020-11-09 LAB — RESP PANEL BY RT-PCR (FLU A&B, COVID) ARPGX2
Influenza A by PCR: NEGATIVE
Influenza B by PCR: NEGATIVE
SARS Coronavirus 2 by RT PCR: NEGATIVE

## 2020-11-09 MED ORDER — PREDNISONE 20 MG PO TABS: 40.0000 mg | ORAL_TABLET | Freq: Every day | ORAL | 0 refills | Status: DC

## 2020-11-09 MED ORDER — DOXYCYCLINE HYCLATE 100 MG PO CAPS: 100.0000 mg | ORAL_CAPSULE | Freq: Two times a day (BID) | ORAL | 0 refills | Status: DC

## 2020-11-09 MED ORDER — ALBUTEROL (5 MG/ML) CONTINUOUS INHALATION SOLN
10.0000 mg/h | INHALATION_SOLUTION | Freq: Once | RESPIRATORY_TRACT | Status: AC
  Administered 2020-11-09: 10 mg/h via RESPIRATORY_TRACT
  Filled 2020-11-09: qty 20

## 2020-11-09 NOTE — ED Triage Notes (Signed)
EMS reports shob x 2 days, productive cough, exp and insp wheezing. 2 duonebs, atrovent, and 125 mg solumedrol given by EMS PTA. 20G LFA. O2 92% on RA. Not on o2 at baseline. Poor PO intake. Denies pain, dizziness, cp, NV. Hx of asthma and copd.

## 2020-11-09 NOTE — ED Provider Notes (Signed)
[facility] DEPT Provider Note   CSN: [id number] Arrival date & time: 11/09/20  0221     History Chief Complaint  Patient presents with   Shortness of Breath   Cough    Anthony Duffy is a 80 y.o. male.  Patient presents to the emergency department for evaluation of shortness of breath.  Patient reports that he has been having increased cough and shortness of breath for the last 2 days.  He does have history of COPD, but reports that he has not needed to use his albuterol nebulizer for some time.  He has not had any fever or chest pain.  Cough is productive of thick sputum at times.  Patient comes to the ER by EMS.  He has received 2 duo nebs, Solu-Medrol 125 mg during transport.      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 01/20/2017   Essential hypertension 01/20/2017   Multiple sclerosis ([place]) 02/19/2015   Malignant neoplasm of prostate ([place]) 12/03/2014    Past Surgical History:  Procedure Laterality Date   CARDIAC SURGERY     CATARACT EXTRACTION W/PHACO  01/05/2012   Procedure: CATARACT EXTRACTION PHACO AND INTRAOCULAR LENS PLACEMENT ([place]);  Surgeon: Adonis Brook, MD;  Location: [facility];  Service: Ophthalmology;  Laterality: Right;   FINGER SURGERY     s/p injury 30 years ago   PROSTATE BIOPSY  10/14/2014       Family History  Problem Relation Age of Onset   Cancer Mother        unknown   Cancer Cousin        unknown    Social History   Tobacco Use   Smoking status: Former    Packs/day: 0.50    Years: 30.00    Pack years: 15.00    Types: Cigarettes    Quit date: 04/05/1992    Years since quitting: 28.6   Smokeless tobacco: Never  Vaping Use   Vaping Use: Never used  Substance Use Topics   Alcohol use: No    Comment: stopped  15 years ago   Drug use: No    Home Medications Prior to Admission medications   Medication Sig Start Date End Date Taking? Authorizing Provider  doxycycline (VIBRAMYCIN) 100 MG capsule Take 1 capsule (100 mg total) by mouth 2 (two) times daily. 11/09/20  Yes Katina Remick, Gwenyth Allegra, MD  predniSONE (DELTASONE) 20 MG tablet Take 2 tablets (40 mg total) by mouth daily with breakfast. 11/09/20  Yes Josalynn Johndrow, Gwenyth Allegra, MD  Accu-Chek FastClix Lancets MISC BLOOD GLUCOSE TESTING FASTING ALTERNATING WITH TWO HOURS AFTER SUPPER 07/14/18   [provider]  albuterol (VENTOLIN HFA) 108 (90 Base) MCG/ACT inhaler Inhale 1-2 puffs into the lungs every 6 (six) hours as needed for wheezing or shortness of breath. 07/14/20   Martyn Ehrich, NP  amLODipine (NORVASC) 10 MG tablet Take 10 mg by mouth daily.    [provider]  BAYER ASPIRIN PO Take 81 mg by mouth.    [provider]  Budeson-Glycopyrrol-Formoterol (BREZTRI AEROSPHERE) 160-9-4.8 MCG/ACT AERO Inhale 2 puffs into the lungs 2 (two) times daily. 07/14/20   Martyn Ehrich, NP  Cholecalciferol 125 MCG (5000 UT) capsule Take  5,000 Units by mouth daily.    [provider]  cyclobenzaprine (FLEXERIL) 10 MG tablet Take 1 tablet by mouth at bedtime as needed. 06/10/17   [provider]  fluticasone (FLONASE) 50 MCG/ACT nasal spray Place 1 spray into both nostrils daily. 06/20/20   Lannie Fields, PA-C  gabapentin (NEURONTIN) 300 MG capsule 300 mg daily. 04/22/15   [provider]  guaiFENesin (MUCINEX) 600 MG 12 hr tablet Take 1 tablet (600 mg total) by mouth 2 (two) times daily. 07/14/20   Martyn Ehrich, NP  ipratropium (ATROVENT HFA) 17 MCG/ACT inhaler Inhale 2 puffs into the lungs every 6 (six) hours as needed for wheezing. 06/20/20   Lannie Fields, PA-C  ipratropium-albuterol (DUONEB) 0.5-2.5 (3) MG/3ML SOLN 1 vial in neb every 6 hours as needed 11/22/16   [provider]  loratadine  (CLARITIN) 10 MG tablet Take 10 mg by mouth daily. 09/08/20   [provider]  losartan-hydrochlorothiazide (HYZAAR) 50-12.5 MG tablet Take 1 tablet by mouth daily. 01/20/17   Tanda Rockers, MD  meloxicam (MOBIC) 15 MG tablet Take 15 mg by mouth daily. 05/13/17   [provider]  metoprolol succinate (TOPROL-XL) 25 MG 24 hr tablet TAKE 1 TABLET BY MOUTH EVERY DAY 06/16/20   Patwardhan, Manish J, MD  rosuvastatin (CRESTOR) 10 MG tablet Take 20 mg by mouth every evening. 05/09/17   [provider]  tamsulosin (FLOMAX) 0.4 MG CAPS capsule Take 0.4 mg by mouth daily. 05/27/15   [provider]  traMADol (ULTRAM) 50 MG tablet Take 1-2 tablets by mouth every 8 (eight) hours as needed. 06/29/18   [provider]    Allergies    Patient has no known allergies.  Review of Systems   Review of Systems  Respiratory:  Positive for cough, shortness of breath and wheezing.   All other systems reviewed and are negative.  Physical Exam Updated Vital Signs BP (!) 154/72   Pulse 96   Temp 97.8 F (36.6 C) (Oral)   Resp 13   SpO2 93%   Physical Exam Vitals and nursing note reviewed.  Constitutional:      General: He is not in acute distress.    Appearance: Normal appearance. He is well-developed.  HENT:     Head: Normocephalic and atraumatic.     Right Ear: Hearing normal.     Left Ear: Hearing normal.     Nose: Nose normal.  Eyes:     Conjunctiva/sclera: Conjunctivae normal.     Pupils: Pupils are equal, round, and reactive to light.  Cardiovascular:     Rate and Rhythm: Regular rhythm.     Heart sounds: S1 normal and S2 normal. No murmur heard.   No friction rub. No gallop.  Pulmonary:     Effort: Pulmonary effort is normal. No respiratory distress.     Breath sounds: Decreased breath sounds, wheezing and rhonchi present.  Chest:     Chest wall: No tenderness.  Abdominal:     General: Bowel sounds are normal.     Palpations: Abdomen is soft.      Tenderness: There is no abdominal tenderness. There is no guarding or rebound. Negative signs include Murphy's sign and McBurney's sign.     Hernia: No hernia is present.  Musculoskeletal:        General: Normal range of motion.     Cervical back: Normal range of motion and neck supple.  Skin:    General: Skin is warm  and dry.     Findings: No rash.  Neurological:     Mental Status: He is alert and oriented to person, place, and time.     GCS: GCS eye subscore is 4. GCS verbal subscore is 5. GCS motor subscore is 6.     Cranial Nerves: No cranial nerve deficit.     Sensory: No sensory deficit.     Coordination: Coordination normal.  Psychiatric:        Speech: Speech normal.        Behavior: Behavior normal.        Thought Content: Thought content normal.    ED Results / Procedures / Treatments   Labs (all labs ordered are listed, but only abnormal results are displayed) Labs Reviewed  BASIC METABOLIC PANEL - Abnormal; Notable for the following components:      Result Value   Glucose, Bld 120 (*)    All other components within normal limits  RESP PANEL BY RT-PCR (FLU A&B, COVID) ARPGX2  CBC    EKG EKG Interpretation  Date/Time:  'Sunday November 09 2020 02:34:46 EDT Ventricular Rate:  85 PR Interval:  164 QRS Duration: 139 QT Interval:  391 QTC Calculation: 465 R Axis:   58 Text Interpretation: Sinus rhythm IVCD, consider atypical RBBB No significant change since last tracing Confirmed by Eleanor Dimichele J ([id number]) on 11/09/2020 2:42:39 AM  Radiology DG Chest 2 View  Result Date: 11/09/2020 CLINICAL DATA:  Shortness of breath EXAM: CHEST - 2 VIEW COMPARISON:  06/20/2020 FINDINGS: The heart size and mediastinal contours are within normal limits. Both lungs are clear. The visualized skeletal structures are unremarkable. IMPRESSION: No active cardiopulmonary disease. Electronically Signed   By: Kevin  Herman M.D.   On: 11/09/2020 03:05    Procedures Procedures    Medications Ordered in ED Medications  albuterol (PROVENTIL,VENTOLIN) solution continuous neb (10 mg/hr Nebulization Given 11/09/20 0354)    ED Course  I have reviewed the triage vital signs and the nursing notes.  Pertinent labs & imaging results that were available during my care of the patient were reviewed by me and considered in my medical decision making (see chart for details).    MDM Rules/Calculators/A&P                           Patient with history of COPD presents to emergency department with 2-day history of increasing cough, congestion and shortness of breath.  Patient treated with bronchodilator therapy by EMS with some improvement.  He also received Solu-Medrol.  Work-up here has been reassuring.  Chest x-ray is clear.  No findings of pneumonia or volume overload.  Blood work unremarkable.  COVID testing is negative.  Presentation consistent with COPD exacerbation.  Patient feeling much better after continuous nebulizer treatment.  Will discharge with prednisone, continue bronchodilator therapy.  Given return precautions.  Final Clinical Impression(s) / ED Diagnoses Final diagnoses:  COPD exacerbation ([facility])    Rx / DC Orders ED Discharge Orders          Ordered    predniSONE (DELTASONE) 20 MG tablet  Daily with breakfast        11/09/20 0704    doxycycline (VIBRAMYCIN) 100 MG capsule  2 times daily        08'$ /07/22 0704             Orpah Greek, MD 11/09/20 [phone]

## 2020-11-09 NOTE — ED Notes (Signed)
Patient transported to X-ray 

## 2020-11-09 NOTE — ED Notes (Signed)
Called respiratory for nebulizer.

## 2020-11-18 ENCOUNTER — Other Ambulatory Visit: Payer: Self-pay | Admitting: Student

## 2020-11-18 ENCOUNTER — Ambulatory Visit
Admission: RE | Admit: 2020-11-18 | Discharge: 2020-11-18 | Disposition: A | Discharge status: Home / Self Care | Payer: Medicare Other | Source: Ambulatory Visit | Attending: Student | Admitting: Student

## 2020-11-18 DIAGNOSIS — R0989 Other specified symptoms and signs involving the circulatory and respiratory systems: Secondary | ICD-10-CM

## 2020-12-01 ENCOUNTER — Encounter (HOSPITAL_COMMUNITY): Payer: Self-pay

## 2020-12-01 ENCOUNTER — Emergency Department (HOSPITAL_COMMUNITY): Payer: Medicare Other

## 2020-12-01 ENCOUNTER — Emergency Department (HOSPITAL_COMMUNITY)
Admission: EM | Admit: 2020-12-01 | Discharge: 2020-12-02 | Disposition: A | Discharge status: Home / Self Care | Payer: Medicare Other | Attending: Emergency Medicine | Admitting: Emergency Medicine

## 2020-12-01 ENCOUNTER — Other Ambulatory Visit: Payer: Self-pay

## 2020-12-01 DIAGNOSIS — Z8546 Personal history of malignant neoplasm of prostate: Secondary | ICD-10-CM | POA: Insufficient documentation

## 2020-12-01 DIAGNOSIS — J45909 Unspecified asthma, uncomplicated: Secondary | ICD-10-CM | POA: Insufficient documentation

## 2020-12-01 DIAGNOSIS — R0602 Shortness of breath: Secondary | ICD-10-CM | POA: Diagnosis present

## 2020-12-01 DIAGNOSIS — Z79899 Other long term (current) drug therapy: Secondary | ICD-10-CM | POA: Diagnosis not present

## 2020-12-01 DIAGNOSIS — J441 Chronic obstructive pulmonary disease with (acute) exacerbation: Secondary | ICD-10-CM | POA: Insufficient documentation

## 2020-12-01 DIAGNOSIS — Z7951 Long term (current) use of inhaled steroids: Secondary | ICD-10-CM | POA: Diagnosis not present

## 2020-12-01 DIAGNOSIS — R062 Wheezing: Secondary | ICD-10-CM

## 2020-12-01 DIAGNOSIS — Z20822 Contact with and (suspected) exposure to covid-19: Secondary | ICD-10-CM | POA: Insufficient documentation

## 2020-12-01 DIAGNOSIS — I10 Essential (primary) hypertension: Secondary | ICD-10-CM | POA: Diagnosis not present

## 2020-12-01 DIAGNOSIS — Z87891 Personal history of nicotine dependence: Secondary | ICD-10-CM | POA: Diagnosis not present

## 2020-12-01 DIAGNOSIS — R059 Cough, unspecified: Secondary | ICD-10-CM

## 2020-12-01 HISTORY — DX: Chronic obstructive pulmonary disease, unspecified: J44.9

## 2020-12-01 LAB — RESP PANEL BY RT-PCR (FLU A&B, COVID) ARPGX2
Influenza A by PCR: NEGATIVE
Influenza B by PCR: NEGATIVE
SARS Coronavirus 2 by RT PCR: NEGATIVE

## 2020-12-01 MED ORDER — METHYLPREDNISOLONE SODIUM SUCC 125 MG IJ SOLR
125.0000 mg | Freq: Once | INTRAMUSCULAR | Status: AC
  Administered 2020-12-01: 125 mg via INTRAMUSCULAR
  Filled 2020-12-01: qty 2

## 2020-12-01 MED ORDER — PREDNISONE 20 MG PO TABS: 40.0000 mg | ORAL_TABLET | Freq: Every day | ORAL | 0 refills | Status: AC

## 2020-12-01 MED ORDER — SODIUM CHLORIDE 0.9 % IV SOLN
250.0000 mg | Freq: Once | INTRAVENOUS | Status: DC
  Filled 2020-12-01: qty 2

## 2020-12-01 MED ORDER — DOXYCYCLINE HYCLATE 100 MG PO CAPS: 100.0000 mg | ORAL_CAPSULE | Freq: Two times a day (BID) | ORAL | 0 refills | Status: AC

## 2020-12-01 MED ORDER — ALBUTEROL SULFATE HFA 108 (90 BASE) MCG/ACT IN AERS
2.0000 | INHALATION_SPRAY | RESPIRATORY_TRACT | Status: DC | PRN
  Administered 2020-12-01 (×2): 2 via RESPIRATORY_TRACT
  Filled 2020-12-01 (×3): qty 6.7

## 2020-12-01 NOTE — ED Provider Notes (Signed)
[name] Provider Note   CSN: [id number] Arrival date & time: 12/01/20  1148     History Chief Complaint  Patient presents with   Shortness of Breath   Cough    Anthony Duffy is a 80 y.o. male.  The history is provided by the patient and medical records. No language interpreter was used.  Shortness of Breath Severity:  Moderate Onset quality:  Gradual Duration:  1 month Timing:  Constant Progression:  Waxing and waning Chronicity:  Recurrent Context: URI   Relieved by:  Nothing Worsened by:  Coughing Ineffective treatments:  Inhaler Associated symptoms: cough, sputum production and wheezing   Associated symptoms: no abdominal pain, no chest pain, no claudication, no diaphoresis, no fever, no headaches and no vomiting   Cough Associated symptoms: shortness of breath and wheezing   Associated symptoms: no chest pain, no chills, no diaphoresis, no fever and no headaches       Past Medical History:  Diagnosis Date   Arthritis    Asthma    as child   Bronchitis    COPD (chronic obstructive pulmonary disease) ([place])    Hypertension    Prostate cancer ([place]) 10/14/2014    Patient Active Problem List   Diagnosis Date Noted   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 01/20/2017   Essential hypertension 01/20/2017   Multiple sclerosis ([place]) 02/19/2015   Malignant neoplasm of prostate ([place]) 12/03/2014    Past Surgical History:  Procedure Laterality Date   CARDIAC SURGERY     CATARACT EXTRACTION W/PHACO  01/05/2012   Procedure: CATARACT EXTRACTION PHACO AND INTRAOCULAR LENS PLACEMENT ([place]);  Surgeon: Adonis Brook, MD;  Location: [facility];  Service: Ophthalmology;  Laterality: Right;   FINGER SURGERY     s/p injury 30 years ago   PROSTATE BIOPSY  10/14/2014       Family History  Problem Relation Age of Onset   Cancer Mother        unknown   Cancer Cousin        unknown    Social History   Tobacco Use   Smoking status:  Former    Packs/day: 0.50    Years: 30.00    Pack years: 15.00    Types: Cigarettes    Quit date: 04/05/1992    Years since quitting: 28.6   Smokeless tobacco: Never  Vaping Use   Vaping Use: Never used  Substance Use Topics   Alcohol use: No    Comment: stopped 15 years ago   Drug use: No    Home Medications Prior to Admission medications   Medication Sig Start Date End Date Taking? Authorizing Provider  Accu-Chek FastClix Lancets MISC BLOOD GLUCOSE TESTING FASTING ALTERNATING WITH TWO HOURS AFTER SUPPER 07/14/18   [provider]  albuterol (VENTOLIN HFA) 108 (90 Base) MCG/ACT inhaler Inhale 1-2 puffs into the lungs every 6 (six) hours as needed for wheezing or shortness of breath. 07/14/20   Martyn Ehrich, NP  amLODipine (NORVASC) 10 MG tablet Take 10 mg by mouth daily.    [provider]  BAYER ASPIRIN PO Take 81 mg by mouth.    [provider]  Budeson-Glycopyrrol-Formoterol (BREZTRI AEROSPHERE) 160-9-4.8 MCG/ACT AERO Inhale 2 puffs into the lungs 2 (two) times daily. 07/14/20   Martyn Ehrich, NP  Cholecalciferol 125 MCG (5000 UT) capsule Take 5,000 Units by mouth daily.    [provider]  cyclobenzaprine (FLEXERIL) 10 MG tablet Take 1 tablet  by mouth at bedtime as needed. 06/10/17   [provider]  doxycycline (VIBRAMYCIN) 100 MG capsule Take 1 capsule (100 mg total) by mouth 2 (two) times daily. 11/09/20   Orpah Greek, MD  fluticasone (FLONASE) 50 MCG/ACT nasal spray Place 1 spray into both nostrils daily. 06/20/20   Lannie Fields, PA-C  gabapentin (NEURONTIN) 300 MG capsule 300 mg daily. 04/22/15   [provider]  guaiFENesin (MUCINEX) 600 MG 12 hr tablet Take 1 tablet (600 mg total) by mouth 2 (two) times daily. 07/14/20   Martyn Ehrich, NP  ipratropium (ATROVENT HFA) 17 MCG/ACT inhaler Inhale 2 puffs into the lungs every 6 (six) hours as needed for wheezing. 06/20/20   Lannie Fields, PA-C   ipratropium-albuterol (DUONEB) 0.5-2.5 (3) MG/3ML SOLN 1 vial in neb every 6 hours as needed 11/22/16   [provider]  loratadine (CLARITIN) 10 MG tablet Take 10 mg by mouth daily. 09/08/20   [provider]  losartan-hydrochlorothiazide (HYZAAR) 50-12.5 MG tablet Take 1 tablet by mouth daily. 01/20/17   Tanda Rockers, MD  meloxicam (MOBIC) 15 MG tablet Take 15 mg by mouth daily. 05/13/17   [provider]  metoprolol succinate (TOPROL-XL) 25 MG 24 hr tablet TAKE 1 TABLET BY MOUTH EVERY DAY 06/16/20   Patwardhan, Reynold Bowen, MD  predniSONE (DELTASONE) 20 MG tablet Take 2 tablets (40 mg total) by mouth daily with breakfast. 11/09/20   Pollina, Gwenyth Allegra, MD  rosuvastatin (CRESTOR) 10 MG tablet Take 20 mg by mouth every evening. 05/09/17   [provider]  tamsulosin (FLOMAX) 0.4 MG CAPS capsule Take 0.4 mg by mouth daily. 05/27/15   [provider]  traMADol (ULTRAM) 50 MG tablet Take 1-2 tablets by mouth every 8 (eight) hours as needed. 06/29/18   [provider]    Allergies    Patient has no known allergies.  Review of Systems   Review of Systems  Constitutional:  Negative for chills, diaphoresis, fatigue and fever.  HENT:  Negative for congestion.   Eyes:  Negative for visual disturbance.  Respiratory:  Positive for cough, sputum production, chest tightness, shortness of breath and wheezing.   Cardiovascular:  Negative for chest pain, claudication and leg swelling.  Gastrointestinal:  Negative for abdominal pain, constipation, diarrhea, nausea and vomiting.  Genitourinary:  Negative for dysuria and flank pain.  Musculoskeletal:  Negative for back pain.  Neurological:  Negative for light-headedness and headaches.  Psychiatric/Behavioral:  Negative for agitation.   All other systems reviewed and are negative.  Physical Exam Updated Vital Signs BP (!) 147/83   Pulse 75   Temp 97.6 F (36.4 C) (Oral)   Resp 18   Ht 6' (1.829 m)   Wt  83.9 kg   SpO2 96%   BMI 25.09 kg/m   Physical Exam Vitals and nursing note reviewed.  Constitutional:      General: He is not in acute distress.    Appearance: He is well-developed. He is not ill-appearing, toxic-appearing or diaphoretic.  HENT:     Head: Normocephalic and atraumatic.  Eyes:     Extraocular Movements: Extraocular movements intact.     Conjunctiva/sclera: Conjunctivae normal.     Pupils: Pupils are equal, round, and reactive to light.  Cardiovascular:     Rate and Rhythm: Normal rate and regular rhythm.     Heart sounds: No murmur heard. Pulmonary:     Effort: Pulmonary effort is normal. No respiratory distress.  Breath sounds: Examination of the right-lower field reveals rhonchi. Examination of the left-lower field reveals rhonchi. Wheezing and rhonchi present. No rales.  Chest:     Chest wall: No tenderness.  Abdominal:     Palpations: Abdomen is soft.     Tenderness: There is no abdominal tenderness.  Musculoskeletal:     Cervical back: Neck supple.     Right lower leg: No tenderness. No edema.     Left lower leg: No tenderness. No edema.  Skin:    General: Skin is warm and dry.     Capillary Refill: Capillary refill takes less than 2 seconds.  Neurological:     General: No focal deficit present.     Mental Status: He is alert.  Psychiatric:        Mood and Affect: Mood normal.    ED Results / Procedures / Treatments   Labs (all labs ordered are listed, but only abnormal results are displayed) Labs Reviewed  RESP PANEL BY RT-PCR (FLU A&B, COVID) [id number]    EKG None  Radiology DG Chest 2 View  Result Date: 12/01/2020 CLINICAL DATA:  Cough and shortness of breath EXAM: CHEST - 2 VIEW COMPARISON:  11/18/2020 FINDINGS: Mild hyperinflation and flattened hemidiaphragms compatible with COPD/emphysema. No acute airspace process, collapse or consolidation. Negative for edema, effusion, or pneumothorax. Trachea midline. Normal heart size and  vascularity. No acute osseous finding. Degenerative changes of the midthoracic spine. IMPRESSION: Stable hyperinflation.  No interval change or acute process. Electronically Signed   By: Jerilynn Mages.  Shick M.D.   On: 12/01/2020 12:53    Procedures Procedures   Medications Ordered in ED Medications  albuterol (VENTOLIN HFA) 108 (90 Base) MCG/ACT inhaler 2 puff (2 puffs Inhalation Given 12/01/20 2015)  methylPREDNISolone sodium succinate (SOLU-MEDROL) 125 mg/2 mL injection 125 mg (125 mg Intramuscular Given 12/01/20 2016)    ED Course  I have reviewed the triage vital signs and the nursing notes.  Pertinent labs & imaging results that were available during my care of the patient were reviewed by me and considered in my medical decision making (see chart for details).    MDM Rules/Calculators/A&P                           Anthony Duffy is a 80 y.o. male with a past medical history significant for COPD, hypertension, multiple sclerosis, and prostate cancer who presents with shortness of breath, wheezing, productive cough.  He reports that he has been having symptoms for the last few weeks and they have not gotten better.  He does denies any COVID exposures and has been vaccinated boosted.  He says that he was given steroid several weeks ago that helped for a brief time but it has continued.  He is denying respiratory distress.  He denies any chest pain, palpitations, nausea, vomiting, urinary changes, GI symptoms, leg pain, or leg swelling.  No trauma.   patient was seen in the waiting room and had work-up started including chest x-ray, COVID test, and was given steroids and a breathing treatment.  By the time I evaluated patient approximately 11-1/2 hours after arrival, his wheezing had began to improve.  He reports his symptoms have drastically improved and he is no longer having the shortness of breath.  He does have some faint wheezes but he reports he always has some.  His lungs do have some faint  rhonchi in the bases but otherwise exam was unremarkable.  No leg tenderness or leg pain.  No chest pain.  Reassuring on the monitor.  Had a shared decision-making conversation with patient.  We discussed further work-up options versus discharge with steroid burst and antibiotics.  Patient would rather get antibiotics and steroid burst and follow-up with his PCP.  Given his improvement in symptoms, patient we discharged home.  He understood return precautions and was discharged in good condition after chest x-ray shows no pneumonia and COVID test was negative.  Patient discharged    Final Clinical Impression(s) / ED Diagnoses Final diagnoses:  Cough  Shortness of breath  COPD exacerbation ([place])  Wheezing    Rx / DC Orders ED Discharge Orders          Ordered    doxycycline (VIBRAMYCIN) 100 MG capsule  2 times daily        12/01/20 2344    predniSONE (DELTASONE) 20 MG tablet  Daily        12/01/20 2344            Clinical Impression: 1. Cough   2. Shortness of breath   3. COPD exacerbation ([place])   4. Wheezing     Disposition: Discharge  Condition: Good  I have discussed the results, Dx and Tx plan with the pt(& family if present). He/she/they expressed understanding and agree(s) with the plan. Discharge instructions discussed at great length. Strict return precautions discussed and pt &/or family have verbalized understanding of the instructions. No further questions at time of discharge.    Discharge Medication List as of 12/01/2020 11:46 PM     START taking these medications   Details  !! doxycycline (VIBRAMYCIN) 100 MG capsule Take 1 capsule (100 mg total) by mouth 2 (two) times daily for 7 days., Starting Mon 12/01/2020, Until Mon 12/08/2020, Print    !! predniSONE (DELTASONE) 20 MG tablet Take 2 tablets (40 mg total) by mouth daily for 5 days., Starting Mon 12/01/2020, Until Sat 12/06/2020, Print     !! - Potential duplicate medications found. Please discuss with  provider.      Follow Up: [facility] [place] [phone] Schedule an appointment as soon as possible for a visit    [facility] [id number] [place]       Dhriti Fales, Gwenyth Allegra, MD 12/02/20 [phone]

## 2020-12-01 NOTE — Discharge Instructions (Addendum)
Your history exam and work-up today were overall reassuring.  There is no evidence of pneumonia on your x-ray however given your productive cough for several weeks, it is reasonable to treat you with antibiotics for presumptive bronchitis or mild pneumonia.  Please take the steroids for the next 5 days as a burst to better treat and use your inhalers at home.  Please rest and stay hydrated and follow-up with your primary doctor.  If any symptoms change or worsen, please return to the nearest emergency department.  Your COVID test is still in process, if it is positive, anticipate they will call you but please check on MyChart.

## 2020-12-01 NOTE — ED Triage Notes (Signed)
Patient c/o increased SOB and coughing x 3 weeks. Patient reports a history of asthma and COPD.  Patient states he has been using Albuterol neb treatments with no relief.

## 2020-12-01 NOTE — ED Provider Notes (Signed)
Emergency Medicine Provider Triage Evaluation Note  Anthony Duffy , a 80 y.o. male  was evaluated in triage.  Pt complains of cough, increased sputum production, wheezing and sob x3 weeks. No chest pain.  Review of Systems  Positive: cough, increased sputum production, wheezing and sob Negative: Chest pain  Physical Exam  BP 137/81 (BP Location: Left Arm)   Pulse 91   Temp 98.2 F (36.8 C) (Oral)   Resp 18   Ht 6' (1.829 m)   Wt 83.9 kg   SpO2 96%   BMI 25.09 kg/m  Gen:   Awake, no distress   Resp:  Normal effort  MSK:   Moves extremities without difficulty  Other:  Diffuse wheezing  Medical Decision Making  Medically screening exam initiated at 12:24 PM.  Appropriate orders placed.  Anthony Duffy was informed that the remainder of the evaluation will be completed by another provider, this initial triage assessment does not replace that evaluation, and the importance of remaining in the ED until their evaluation is complete.     Anthony Duffy 12/01/20 1225    Lacretia Leigh, MD 12/02/20 [phone]

## 2021-01-01 ENCOUNTER — Ambulatory Visit (INDEPENDENT_AMBULATORY_CARE_PROVIDER_SITE_OTHER): Payer: Medicare Other | Admitting: Sports Medicine

## 2021-01-01 ENCOUNTER — Other Ambulatory Visit: Payer: Self-pay

## 2021-01-01 ENCOUNTER — Encounter: Payer: Self-pay | Admitting: Sports Medicine

## 2021-01-01 DIAGNOSIS — M2041 Other hammer toe(s) (acquired), right foot: Secondary | ICD-10-CM

## 2021-01-01 DIAGNOSIS — B351 Tinea unguium: Secondary | ICD-10-CM | POA: Diagnosis not present

## 2021-01-01 DIAGNOSIS — M79675 Pain in left toe(s): Secondary | ICD-10-CM | POA: Diagnosis not present

## 2021-01-01 DIAGNOSIS — G35 Multiple sclerosis: Secondary | ICD-10-CM

## 2021-01-01 DIAGNOSIS — M79674 Pain in right toe(s): Secondary | ICD-10-CM

## 2021-01-01 DIAGNOSIS — M21619 Bunion of unspecified foot: Secondary | ICD-10-CM

## 2021-01-01 DIAGNOSIS — M2042 Other hammer toe(s) (acquired), left foot: Secondary | ICD-10-CM

## 2021-01-01 NOTE — Progress Notes (Signed)
Subjective:  Anthony Duffy is a 80 y.o. male patient seen today in office with complaint of mildly painful thickened and elongated toenails; unable to trim. Patient  have MS.  Denies any changes with meds or health since last encounter except stiffness to toes. Patient has no other pedal complaints at this time.    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 18 g 2   amLODipine (NORVASC) 10 MG tablet Take 10 mg by mouth daily.     BAYER ASPIRIN PO Take 81 mg by mouth.     Budeson-Glycopyrrol-Formoterol (BREZTRI AEROSPHERE) 160-9-4.8 MCG/ACT AERO Inhale 2 puffs into the lungs 2 (two) times daily. 10.7 g 11   Cholecalciferol 125 MCG (5000 UT) capsule Take 5,000 Units by mouth daily.     cyclobenzaprine (FLEXERIL) 10 MG tablet Take 1 tablet by mouth at bedtime as needed.  3   doxycycline (VIBRAMYCIN) 100 MG capsule Take 1 capsule (100 mg total) by mouth 2 (two) times daily. 14 capsule 0   fluticasone (FLONASE) 50 MCG/ACT nasal spray Place 1 spray into both nostrils daily. 9.9 mL 2   gabapentin (NEURONTIN) 300 MG capsule 300 mg daily.     guaiFENesin (MUCINEX) 600 MG 12 hr tablet Take 1 tablet (600 mg total) by mouth 2 (two) times daily. 2 tablet 0   ipratropium (ATROVENT HFA) 17 MCG/ACT inhaler Inhale 2 puffs into the lungs every 6 (six) hours as needed for wheezing. 1 each 12   ipratropium-albuterol (DUONEB) 0.5-2.5 (3) MG/3ML SOLN 1 vial in neb every 6 hours as  needed  0   loratadine (CLARITIN) 10 MG tablet Take 10 mg by mouth daily.     losartan-hydrochlorothiazide (HYZAAR) 50-12.5 MG tablet Take 1 tablet by mouth daily. 30 tablet 11   meloxicam (MOBIC) 15 MG tablet Take 15 mg by mouth daily.  4   metoprolol succinate (TOPROL-XL) 25 MG 24 hr tablet TAKE 1 TABLET BY MOUTH EVERY DAY 90 tablet 0   predniSONE (DELTASONE) 20 MG tablet Take 2 tablets (40 mg total) by mouth daily with breakfast. 10 tablet 0   rosuvastatin (CRESTOR) 10 MG tablet Take 20 mg by mouth every evening.  2   tamsulosin (FLOMAX) 0.4 MG CAPS capsule Take 0.4 mg by mouth daily.  2   traMADol (ULTRAM) 50 MG tablet Take 1-2 tablets by mouth every 8 (eight) hours as needed.     No current facility-administered medications on file prior to visit.    No Known Allergies  Objective: Physical Exam  General: Well developed, nourished, no acute distress, awake, alert and oriented x 3  Vascular: Dorsalis pedis artery 2/4 bilateral, Posterior tibial artery 1/4 bilateral, skin temperature warm to warm proximal to distal bilateral lower extremities, no varicosities, pedal hair present bilateral.  Neurological: Gross sensation present via light touch bilateral.   Dermatological: Skin is warm, dry, and supple bilateral, Nails 1-10 are tender, long, thick, and discolored with mild subungal debris, no  webspace macerations present bilateral, no open lesions present bilateral, no callus/corns/hyperkeratotic tissue present bilateral. No signs of infection bilateral.  Musculoskeletal: Asymptomatic bunion and hammertoe boney deformities noted bilateral. Muscular strength within normal limits without painon range of motion.  There is limited first MPJ range of motion bilateral consistent with hallux rigidus.  No pain with calf compression bilateral.  Assessment and Plan:  Problem List Items Addressed This Visit       Nervous and Auditory   Multiple sclerosis ([place])   Other Visit Diagnoses      Pain due to onychomycosis of toenails of both feet    -  Primary   Bunion       Hammer toes of both feet           -Examined patient.  -Discussed treatment options for painful mycotic nails. -Mechanically debrided and reduced mycotic nails with sterile nail nipper and dremel nail file without incident. -Continue with good supportive shoes daily for foot type -Recommend topical voltaren for subjective stiffness in toes -Patient to return in 3 months for follow up evaluation or sooner if symptoms worsen.  Landis Martins, DPM

## 2021-01-01 NOTE — Patient Instructions (Signed)
Topical voltaren gel 1%, may purchase OTC to use as needed for pain or discomfort at [facility]

## 2021-01-13 ENCOUNTER — Ambulatory Visit: Payer: Medicare Other | Admitting: Internal Medicine

## 2021-02-17 ENCOUNTER — Ambulatory Visit (INDEPENDENT_AMBULATORY_CARE_PROVIDER_SITE_OTHER): Payer: Medicare Other | Admitting: Internal Medicine

## 2021-02-17 ENCOUNTER — Other Ambulatory Visit: Payer: Self-pay

## 2021-02-17 ENCOUNTER — Encounter: Payer: Self-pay | Admitting: Internal Medicine

## 2021-02-17 DIAGNOSIS — J449 Chronic obstructive pulmonary disease, unspecified: Secondary | ICD-10-CM

## 2021-02-17 MED ORDER — FORMOTEROL FUMARATE 20 MCG/2ML IN NEBU: 20.0000 ug | INHALATION_SOLUTION | Freq: Two times a day (BID) | RESPIRATORY_TRACT | 11 refills | Status: DC

## 2021-02-17 MED ORDER — BUDESONIDE 0.25 MG/2ML IN SUSP: 0.2500 mg | Freq: Two times a day (BID) | RESPIRATORY_TRACT | 11 refills | Status: DC

## 2021-02-17 MED ORDER — PREDNISONE 10 MG PO TABS: ORAL_TABLET | ORAL | 0 refills | Status: DC

## 2021-02-17 NOTE — Assessment & Plan Note (Addendum)
Quit smoking 1994 01/20/2017  After extensive coaching HFA effectiveness =    75% from a baseline of near zero - 01/20/2017 try dulera 100 2bid and off acei   - Spirometry 03/11/2017  FEV1 0.52 (17%)  Ratio 40   - 03/11/2017    try symb 160 x 6 week free trial then ov > improved - Spirometry 04/08/2017  FEV1 1.66 (55%)  Ratio 61 p am symb 160 x 2  - 07/07/2017  After extensive coaching inhaler device  effectiveness =    75% (Ti too short)  - 10/07/2017  PFTs   FEV1 2.11(74%), Ratio 69, DLCO 60 - 04/10/2019   try breztri 2bid - 04/10/2020  After extensive coaching inhaler device,  effectiveness =    75% (short Ti) rec continue breztri unless problem with urination > change to symb 160  - 02/17/2021 changed to performist/ bud 0.25 mg bid as could not use HFA and can't do LAMA due to prostate issues    Group D in terms of symptom/risk and laba/lama/ICS  therefore appropriate rx at this point >>>  Laba/ics best choice per neb and not LAMA based on above concerns  Re SABA :  I spent extra time with pt today reviewing appropriate use of albuterol for prn use on exertion with the following points: 1) saba is for relief of sob that does not improve by walking a slower pace or resting but rather if the pt does not improve after trying this first. 2) If the pt is convinced, as many are, that saba helps recover from activity faster then it's easy to tell if this is the case by re-challenging : ie stop, take the inhaler, then p 5 minutes try the exact same activity (intensity of workload) that just caused the symptoms and see if they are substantially diminished or not after saba 3) if there is an activity that reproducibly causes the symptoms, try the saba 15 min before the activity on alternate days   If in fact the saba really does help, then fine to continue to use it prn but advised may need to look closer at the maintenance regimen being used to achieve better control of airways disease with exertion.           Each maintenance medication was reviewed in detail including emphasizing most importantly the difference between maintenance and prns and under what circumstances the prns are to be triggered using an action plan format where appropriate.  Total time for H and P, chart review, counseling, reviewing hfa/neb device(s) and generating customized AVS unique to this office visit / same day charting = 34 min

## 2021-02-17 NOTE — Progress Notes (Signed)
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. Pt c/o SOB off and on for the past year. He states "I get tired real quick".   no trouble at rest sitting/ sometimes a little sob immediately on lying down like getting choked  Walks dog qod s sob / not regularly  In ER x 4 since 08/2016 with sob / "congestion" with white mucus  rec Stop lisnopril /HCTZ ( prinizide ) and start losartan 50-12.5 one daily and ok to double it if needed Plan A = Automatic =  Dulera (blue) 100 = Symbicort 80 Take 2 puffs first thing in am and then another 2 puffs about 12 hours later.  Work on inhaler technique:  Plan B = Backup Only use your albuterol(Proair)  Plan C = Crisis - only use your albuterol nebulizer if you first try Plan B   Please schedule a follow up office visit in 6 weeks with pfts  call sooner if needed with all medications /inhalers/ solutions in hand so we can verify exactly what you are taking. This includes all medications from all doctors and over the counters     03/11/2017  f/u ov/Anthony Duffy re: ? GOLD IV copd / confused with meds / did not bring as req  Chief Complaint  Patient presents with   Follow-up    Pt c/o increased SOB, wheezing and prod cough with white sputum for the past wk.  He states he feels like he is gasping for air, even wakes up in the night with this. He has been using his albuterol inhaler and neb at least 2 x per day.    Am of ov 6 h prior to OV  = neb / has dulera but it's on 0 and pt not sure when that occurred, still has sprays left and over the last week gradually worse sob  to point where sob at rest x after the neb rec duoneb x 1 treatment Prednisone 10 mg take  4 each am x 2 days,   2 each am x 2 days,  1 each am x 2 days and stop  Plan A = Automatic = Symbicort 160 Take 2 puffs first thing in am and then another 2 puffs about 12 hours later.  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 He is using his albuterol inhaler and neb both about once per day.   Dyspnea:  MMRC1 = can walk nl pace, flat grade, can't hurry or go uphills or steps s sob   Cough: none now  Sleeping: ok flat bed one pillow  SABA use: about once a day when over does it / poor insight re whether to use hfa or neb  02: none  rec Plan A = Automatic = symbicort 160 Take 2 puffs first thing in am and then another 2 puffs about 12 hours later.  Work on inhaler technique:  Plan B = Backup Only use your albuterol (PROAIR) as a rescue medication  Plan C = Crisis - only use your albuterol nebulizer if you first try Plan B and it fails to help > ok to use the nebulizer up to every 4 hours but if start needing it regularly call for immediate appointment    04/10/2019  f/u ov/Anthony Duffy re: copd II Chief Complaint  Patient presents with   Follow-up    Pt states he has been feeling pretty good but breathing is a "little short". Pt denies any cough, wheezing, fever or chills.  Dyspnea:  MMRC1 = can walk nl pace, flat grade, can't hurry or go uphills or steps s sob    Cough: no  Sleeping: ok  SABA use: rarely now  02: none  rec Plan A = Automatic = Always=    Bretri Take 2 puffs first thing in am and then another 2 puffs about 12 hours later.  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 He is using his albuterol inhaler 3 x per day on average.   Dyspnea:  MMRC1 = can walk nl pace, flat grade, can't hurry or go uphills or steps s sob   Cough: none  Sleeping: sleep flat/one big pillow under head  SABA use: confused and using symbicort prn  02: none  Rec Plan A = Automatic = Always=    Breztri (or Symbicort 80 but not both) Take 2 puffs first thing in am and then another 2 puffs about 12 hours later.  Work on inhaler technique:   If having trouble with peeing change to Symbicort (RED)  Plan B = Backup (to supplement plan A, not to replace it) Only use your Albuterol inhaler as a rescue medication  Plan C = Crisis (instead of Plan B but only if Plan B stops working) - only use your albuterol nebulizer if you first try Plan B  Please schedule a follow up visit in 3 months but call sooner if needed  with all medications /inhalers/ solutions in hand so we can verify exactly what you are taking. This includes all medications from all doctors and over the counters        02/17/2021  f/u ov/Anthony Duffy re: GOLD 2    maint on ???  Does not know names/ only brought albuterol hfa and doesn't know how to  use it Chief Complaint   Patient presents with   Follow-up    Increased sob and clear congestion for 2-3 months.  Dyspnea:  MMRC2 = can't walk a nl pace on a flat grade s sob but does fine slow and flat eg grocery shopping  Cough: clear mucus  Sleeping: worse cough at hs x sev months  SABA use: hfa and neb but very poor use of hva  02: none  Covid status:  says vax x  5    No obvious day to day or daytime variability or assoc  purulent sputum or mucus plugs or hemoptysis or cp or chest tightness, subjective wheeze or overt sinus or hb symptoms.     Also denies any obvious fluctuation of symptoms with weather or environmental changes or other aggravating or alleviating factors except as outlined above   No unusual exposure hx or h/o childhood pna/ asthma or knowledge of premature birth.  Current Allergies, Complete Past Medical History, Past Surgical History, Family History, and Social History were reviewed in [employer] record.  ROS  The following are not active complaints unless bolded Hoarseness, sore throat, dysphagia, dental problems, itching, sneezing,  nasal congestion or discharge of excess mucus or purulent secretions, ear ache,   fever, chills, sweats, unintended wt loss or wt gain, classically pleuritic or exertional cp,  orthopnea pnd or arm/hand swelling  or leg swelling, presyncope, palpitations, abdominal pain, anorexia, nausea, vomiting, diarrhea  or change in bowel habits or change in bladder habits, change in stools or change in urine, dysuria, hematuria,  rash, arthralgias, visual complaints, headache, numbness, weakness or ataxia or problems with walking or coordination,  change in mood or  memory.        Current Meds -  - NOTE:   Unable to verify as accurately reflecting what pt takes     Medication Sig   Accu-Chek FastClix Lancets MISC BLOOD GLUCOSE TESTING FASTING ALTERNATING WITH TWO HOURS AFTER SUPPER   albuterol (VENTOLIN HFA) 108 (90 Base) MCG/ACT inhaler Inhale  1-2 puffs into the lungs every 6 (six) hours as needed for wheezing or shortness of breath.   amLODipine (NORVASC) 10 MG tablet Take 10 mg by mouth daily.   BAYER ASPIRIN PO Take 81 mg by mouth.   Budeson-Glycopyrrol-Formoterol (BREZTRI AEROSPHERE) 160-9-4.8 MCG/ACT AERO Inhale 2 puffs into the lungs 2 (two) times daily.   Cholecalciferol 125 MCG (5000 UT) capsule Take 5,000 Units by mouth daily.   cyclobenzaprine (FLEXERIL) 10 MG tablet Take 1 tablet by mouth at bedtime as needed.   fluticasone (FLONASE) 50 MCG/ACT nasal spray Place 1 spray into both nostrils daily.   gabapentin (NEURONTIN) 300 MG capsule 300 mg daily.   ipratropium (ATROVENT HFA) 17 MCG/ACT inhaler Inhale 2 puffs into the lungs every 6 (six) hours as needed for wheezing.   ipratropium-albuterol (DUONEB) 0.5-2.5 (3) MG/3ML SOLN 1 vial in neb every 6 hours as needed   losartan-hydrochlorothiazide (HYZAAR) 50-12.5 MG tablet Take 1 tablet by mouth daily.   meloxicam (MOBIC) 15 MG tablet Take 15 mg by mouth daily.   metoprolol succinate (TOPROL-XL) 25 MG 24 hr tablet TAKE 1 TABLET BY MOUTH EVERY DAY   rosuvastatin (CRESTOR) 10 MG tablet Take 20 mg by mouth every evening.   tamsulosin (FLOMAX) 0.4 MG CAPS capsule Take 0.4 mg by mouth daily.   traMADol (ULTRAM) 50 MG tablet Take 1-2 tablets by mouth every 8 (eight) hours as needed.  Objective:   Physical Exam    02/17/2021      179  04/10/2020           189 04/10/2019           193  04/10/2018           189  07/07/2017           184  04/08/2017          180  03/11/2017       177   01/20/17 176 lb (79.8 kg)  08/08/16 165 lb (74.8 kg)  10/28/15 160 lb (72.6 kg)     Vital signs reviewed  02/17/2021  - Note at rest 02 sats  97% on RA   General appearance:    pleasantt amb bm very easily confused with details of care   Poor  Dentition with two partial plates   HEENT : pt wearing mask not removed for exam due to covid -19 concerns.    NECK :   without JVD/Nodes/TM/ nl carotid upstrokes bilaterally   LUNGS: no acc muscle use,  Mod barrel  contour chest wall with bilateral  pan exp wheeze and  without cough on insp or exp maneuvers and mod  Hyperresonant  to  percussion bilaterally     CV:  RRR  no s3 or murmur or increase in P2, and no edema   ABD:  soft and nontender with pos mid insp Hoover's  in the supine position. No bruits or organomegaly appreciated, bowel sounds nl  MS:     ext warm without deformities, calf tenderness, cyanosis or clubbing No obvious joint restrictions   SKIN: warm and dry without lesions    NEURO:  alert, approp, nl sensorium with  no motor or cerebellar deficits apparent.     I personally reviewed images and agree with radiology impression as follows:  CXR:   12/01/20 Stable hyperinflation.  No interval change or acute process.            Assessment:

## 2021-02-17 NOTE — Patient Instructions (Addendum)
Plan A = Automatic = Always=    Performist and Budesonide 0.25  twice daily  - 1st thing in am and 12 hours later  Prednisone 10 mg take  4 each am x 2 days,   2 each am x 2 days,  1 each am x 2 days and stop   Plan B = Backup (to supplement plan A, not to replace it) Only use your albuterol inhaler as a rescue medication to be used if you can't catch your breath by resting or doing a relaxed purse lip breathing pattern.  - The less you use it, the better it will work when you need it. - Ok to use the inhaler up to 2 puffs  every 4 hours if you must but call for appointment if use goes up over your usual need - Don't leave home without it !!  (think of it like the spare tire for your car)  Work on inhaler technique:  relax and gently blow all the way out then take a nice smooth full deep breath back in, triggering the inhaler at same time you start breathing in.  Hold for up to 5 seconds if you can.      Plan C = Crisis (instead of Plan B but only if Plan B stops working) - only use your albuterol nebulizer if you first try Plan B and it fails to help > ok to use the nebulizer up to every 4 hours but if start needing it regularly call for immediate appointment    Please schedule a follow up office visit in 4 weeks, sooner if needed  with all medications /inhalers/ solutions in hand so we can verify exactly what you are taking. This includes all medications from all doctors and over the counters

## 2021-02-18 ENCOUNTER — Telehealth: Payer: Self-pay | Admitting: Internal Medicine

## 2021-02-18 MED ORDER — ALBUTEROL SULFATE HFA 108 (90 BASE) MCG/ACT IN AERS: 1.0000 | INHALATION_SPRAY | Freq: Four times a day (QID) | RESPIRATORY_TRACT | 2 refills | Status: DC | PRN

## 2021-02-18 MED ORDER — IPRATROPIUM-ALBUTEROL 0.5-2.5 (3) MG/3ML IN SOLN: 3.0000 mL | RESPIRATORY_TRACT | 4 refills | Status: AC | PRN

## 2021-02-18 NOTE — Telephone Encounter (Signed)
Medications have been sent to the pharmacy per pts request.  Nothing further is needed.

## 2021-02-19 ENCOUNTER — Telehealth: Payer: Self-pay | Admitting: Pharmacy Technician

## 2021-02-19 NOTE — Telephone Encounter (Addendum)
Received PA requests for Budesonide and Formoterol.  Submitted a Prior Authorization request to [employer] for  Budesonide  via CoverMyMeds. Will update once we receive a response.  (KeyRonald Pippins) - [id number]  Submitted a Prior Authorization request to [facility] for  Formoterol Fumarate  via CoverMyMeds. Will update once we receive a response.   Gita KudoGeorgia Duff) - [id number]

## 2021-02-25 MED ORDER — FORMOTEROL FUMARATE 20 MCG/2ML IN NEBU: 20.0000 ug | INHALATION_SOLUTION | Freq: Two times a day (BID) | RESPIRATORY_TRACT | 11 refills | Status: DC

## 2021-02-25 NOTE — Telephone Encounter (Signed)
Placed FORMOTEROL NEB SOL through [facility]. Nothing further needed at this time.

## 2021-02-25 NOTE — Addendum Note (Signed)
Addended by: Gavin Potters R on: 02/25/2021 11:45 AM   Modules accepted: Orders

## 2021-02-25 NOTE — Telephone Encounter (Signed)
Received a fax regarding Prior Authorization from [employer] for BUDESONIDE 0.25MG /2ML. Authorization has been DENIED because MEDICATION NOT COVERED WITH PART D. MAY BE COVERED UNDER PART B

## 2021-02-25 NOTE — Telephone Encounter (Signed)
Received a fax regarding Prior Authorization from [employer] for FORMOTEROL NEB SOL. Authorization has been DENIED because DOES NOT MEET CRITERIA FOR COVERAGE WITH MEDICARE PART D. MAY BE COVERED UNDER MEDICARE PART B.

## 2021-02-27 ENCOUNTER — Telehealth: Payer: Self-pay | Admitting: Pharmacy Technician

## 2021-02-27 NOTE — Telephone Encounter (Signed)
Was this filed under her medicare part B? If so, it should be covered.

## 2021-02-27 NOTE — Telephone Encounter (Signed)
Patient Advocate Encounter  Received notification from [employer] that prior authorization for Ipratropium-Albuterol 0.5-2.5 (3)MG/3ML solution is required.   PA submitted on 11.25.22 Key  [id number]  Status is pending   [facility] will continue to follow  Anthony Duffy, CPhT Patient Advocate Phone: [phone] Fax:  [phone]  Received a fax regarding Prior Authorization from [place] for pratropium-Albuterol 0.5-2.5 (3)MG/3ML solution. Authorization has been DENIED.

## 2021-02-27 NOTE — Telephone Encounter (Signed)
No, the denial was through Medicare Part D. Part B will cover this medication without a PA.

## 2021-03-04 ENCOUNTER — Telehealth: Payer: Self-pay | Admitting: Internal Medicine

## 2021-03-04 DIAGNOSIS — J449 Chronic obstructive pulmonary disease, unspecified: Secondary | ICD-10-CM

## 2021-03-04 NOTE — Telephone Encounter (Signed)
Call returned to patient, confirmed DOB. Patient states he has received the nebulizer medication but not the nebulizer machine. I made patient aware according to the documentation Dr Melvyn Novas was under the impression that he already has a nebulizer machine. He states he has one from 10 years but he does not think it even works.   MW FYI order placed for nebulizer machine.   Nothing further needed at this time.

## 2021-03-06 ENCOUNTER — Telehealth: Payer: Self-pay | Admitting: Internal Medicine

## 2021-03-06 NOTE — Telephone Encounter (Signed)
I called and spoke with Anthony Duffy and she has corrected the order and will fax a CMN for the provider to sign. Nothing further needed.

## 2021-03-09 ENCOUNTER — Telehealth: Payer: Self-pay | Admitting: Internal Medicine

## 2021-03-09 DIAGNOSIS — J449 Chronic obstructive pulmonary disease, unspecified: Secondary | ICD-10-CM

## 2021-03-09 NOTE — Telephone Encounter (Signed)
Margaret from [employer] would like office note and order for [employer]. Margaret phone number is [phone]. Fax number is [phone].

## 2021-03-10 NOTE — Telephone Encounter (Signed)
Called [facility]- advised pt is already getting nebs from [facility]   Called Joycelyn Schmid back to verify msg  They need [place] to fax order for neb machine and ov note to them at the number given which I verified  This was done and nothing further needed

## 2021-03-17 ENCOUNTER — Other Ambulatory Visit: Payer: Self-pay

## 2021-03-17 ENCOUNTER — Ambulatory Visit (INDEPENDENT_AMBULATORY_CARE_PROVIDER_SITE_OTHER): Payer: Medicare Other | Admitting: Nurse Practitioner

## 2021-03-17 ENCOUNTER — Encounter: Payer: Self-pay | Admitting: Nurse Practitioner

## 2021-03-17 VITALS — BP 122/68 | HR 65 | Temp 98.3°F | Ht 72.0 in | Wt 176.6 lb

## 2021-03-17 DIAGNOSIS — J449 Chronic obstructive pulmonary disease, unspecified: Secondary | ICD-10-CM | POA: Diagnosis not present

## 2021-03-17 NOTE — Patient Instructions (Addendum)
-  Continue budesonide (pulmicort) 70mL nebulizer Twice daily, brush tongue and rinse mouth afterwards -Continue formoterol (Perforomist) 2 mL nebulizer Twice daily  -Continue Duoneb (ipatropium/albuterol) 42mL nebulizer to AS NEEDED every 4 hours for shortness of breath or wheezing -Continue albuterol inhaler 1-2 puffs every 6 hours AS NEEDED for shortness of breath or wheezing -Continue flonase nasal spray 1-2 sprays each nostril daily -Continue Singulair 10 mg At bedtime   Notify if worsening breathlessness, cough, mucus production, fatigue, or wheezing occurs.  Maintain up to date vaccinations, including influenza, COVID, and pneumococcal.  Wash your hands often and avoid sick exposures.  Encouraged masking in crowds.  Smoking cessation strongly encouraged and discussed.  Avoid triggers, when possible.  Exercise, as tolerated. Notify if worsening symptoms upon exertion occur.    Follow up in 3 months with Dr. Melvyn Novas or APP. If symptoms do not improve or worsen, please contact office for sooner follow up or seek emergency care.

## 2021-03-17 NOTE — Assessment & Plan Note (Signed)
Significant improvement. Well-controlled on ICS/LABA nebs. Has PRN duoneb and rescue inhaler available. No LAMA therapy currently d/t prostate. Continue singulair and flonase.   Patient Instructions  -Continue budesonide (pulmicort) 95mL nebulizer Twice daily, brush tongue and rinse mouth afterwards -Continue formoterol (Perforomist) 2 mL nebulizer Twice daily  -Continue Duoneb (ipatropium/albuterol) 18mL nebulizer to AS NEEDED every 4 hours for shortness of breath or wheezing -Continue albuterol inhaler 1-2 puffs every 6 hours AS NEEDED for shortness of breath or wheezing -Continue flonase nasal spray 1-2 sprays each nostril daily -Continue Singulair 10 mg At bedtime   Notify if worsening breathlessness, cough, mucus production, fatigue, or wheezing occurs.  Maintain up to date vaccinations, including influenza, COVID, and pneumococcal.  Wash your hands often and avoid sick exposures.  Encouraged masking in crowds.  Smoking cessation strongly encouraged and discussed.  Avoid triggers, when possible.  Exercise, as tolerated. Notify if worsening symptoms upon exertion occur.    Follow up in 3 months with Dr. Melvyn Novas or APP. If symptoms do not improve or worsen, please contact office for sooner follow up or seek emergency care.

## 2021-03-17 NOTE — Progress Notes (Signed)
@Patient  ID: Anthony Duffy, male    DOB: 1940/05/15, 80 y.o.   MRN: [id number]  Chief Complaint  Patient presents with   Follow-up    4wk f/u for COPD. States he has been doing well since last visit. Still using Perforomist and Pulmicort.     Referring provider: No ref. provider found  HPI: 80 year old male, former smoker (15 pack year hx) followed for COPD GOLD II. He is a patient of Dr. Gustavus Bryant and was last seen in office on 02/17/2021. Past medical history significant for HTN, MS, and prostate cancer.   TEST/EVENTS:  10/07/2017 PFTs: FVC 3.21 (79), FEV1 2.22 (74), ratio 69, RV 3 (111), TLC 6.21 (83), DLCO corrected 23.68 (67) 10/15/2020 echocardiogram: LVEF 60 to 65%.  G1 DD.  Mildly elevated PA SP.  Mild MV regurg.  Mild calcification of AV.  IVC dilated in size with greater than 50% respiratory variability. 12/01/2020 CXR 2 view: Mild hyperinflation and flattened hemidiaphragms compatible with COPD/emphysema.  No acute airspace process, collapse or consolidation.  Negative for edema, effusion, pneumothorax.  Stable hyperinflation  04/10/2020: OV with Dr. Melvyn Novas. Rx Breztri or symbicort (one or the other) Twice daily. Albuterol PRN. Difficulties with understanding medications and proper usage per note.  02/17/2021: OV with Dr. Melvyn Novas. Difficulties with proper inhaler use. No LAMA therapy d/t prostate problems. Switched to performist 0.25mg  and albuterol nebs. Follow up 4 weeks.  03/17/2021: Today - 4 wk follow up Patient presents today for four week follow up. His maintenance medications were changed to nebulizer solution with formoterol and budesonide Twice daily. He reports his breathing is significantly improved and he feels much better. He rarely is experiencing shortness of breath, only with rigorous activity, and his cough is much improved. He denies any wheezing, orthopnea, PND, chest pain, or lower extremity edema. He continues on his fomoterol, budesonide, flonase, and singulair. He rarely  uses his rescue inhaler now. Overall, he feels well and offers no further complaints.   No Known Allergies  Immunization History  Administered Date(s) Administered   Fluad Quad(high Dose 65+) 01/28/2021   Influenza, High Dose Seasonal PF 01/20/2017, 12/04/2018   PFIZER(Purple Top)SARS-COV-2 Vaccination 04/30/2019, 05/21/2019    Past Medical History:  Diagnosis Date   Arthritis    Asthma    as child   Bronchitis    COPD (chronic obstructive pulmonary disease) ([place])    Hypertension    Prostate cancer ([place]) 10/14/2014    Tobacco History: Social History   Tobacco Use  Smoking Status Former   Packs/day: 0.50   Years: 30.00   Pack years: 15.00   Types: Cigarettes   Quit date: 04/05/1992   Years since quitting: 28.9  Smokeless Tobacco Never   Counseling given: Not Answered   Outpatient Medications Prior to Visit  Medication Sig Dispense Refill   Accu-Chek FastClix Lancets MISC BLOOD GLUCOSE TESTING FASTING ALTERNATING WITH TWO HOURS AFTER SUPPER     albuterol (VENTOLIN HFA) 108 (90 Base) MCG/ACT inhaler Inhale 1-2 puffs into the lungs every 6 (six) hours as needed for wheezing or shortness of breath. 18 g 2   amLODipine (NORVASC) 10 MG tablet Take 10 mg by mouth daily.     BAYER ASPIRIN PO Take 81 mg by mouth.     budesonide (PULMICORT) 0.25 MG/2ML nebulizer solution Take 2 mLs (0.25 mg total) by nebulization in the morning and at bedtime. 120 mL 11   Cholecalciferol 125 MCG (5000 UT) capsule Take 5,000 Units by mouth daily.  cyclobenzaprine (FLEXERIL) 10 MG tablet Take 1 tablet by mouth at bedtime as needed.  3   fluticasone (FLONASE) 50 MCG/ACT nasal spray Place 1 spray into both nostrils daily. 9.9 mL 2   formoterol (PERFOROMIST) 20 MCG/2ML nebulizer solution Take 2 mLs (20 mcg total) by nebulization 2 (two) times daily. 60 mL 11   gabapentin (NEURONTIN) 300 MG capsule 300 mg daily.     ipratropium-albuterol (DUONEB) 0.5-2.5 (3) MG/3ML SOLN Take 3 mLs by nebulization  every 4 (four) hours as needed. 1 vial in neb every 6 hours as needed 360 mL 4   losartan-hydrochlorothiazide (HYZAAR) 50-12.5 MG tablet Take 1 tablet by mouth daily. 30 tablet 11   meloxicam (MOBIC) 15 MG tablet Take 15 mg by mouth daily.  4   metoprolol succinate (TOPROL-XL) 25 MG 24 hr tablet TAKE 1 TABLET BY MOUTH EVERY DAY 90 tablet 0   montelukast (SINGULAIR) 10 MG tablet Take 10 mg by mouth at bedtime.     rosuvastatin (CRESTOR) 10 MG tablet Take 20 mg by mouth every evening.  2   tamsulosin (FLOMAX) 0.4 MG CAPS capsule Take 0.4 mg by mouth daily.  2   guaiFENesin (MUCINEX) 600 MG 12 hr tablet Take 1 tablet (600 mg total) by mouth 2 (two) times daily. (Patient not taking: Reported on 02/17/2021) 2 tablet 0   loratadine (CLARITIN) 10 MG tablet Take 10 mg by mouth daily. (Patient not taking: Reported on 02/17/2021)     predniSONE (DELTASONE) 10 MG tablet Take  4 each am x 2 days,   2 each am x 2 days,  1 each am x 2 days and stop 14 tablet 0   traMADol (ULTRAM) 50 MG tablet Take 1-2 tablets by mouth every 8 (eight) hours as needed.     No facility-administered medications prior to visit.     Review of Systems:   Constitutional: No weight loss or gain, night sweats, fevers, chills, fatigue, or lassitude. HEENT: No headaches, difficulty swallowing, tooth/dental problems, or sore throat. No sneezing, itching, ear ache, nasal congestion, or post nasal drip CV:  No chest pain, orthopnea, PND, swelling in lower extremities, anasarca, dizziness, palpitations, syncope Resp: +shortness of breath with exertion(significant improvement; only with rigorous activity). No excess mucus or change in color of mucus. No productive or non-productive. No hemoptysis. No wheezing.  No chest wall deformity GI:  No heartburn, indigestion, abdominal pain, nausea, vomiting, diarrhea, change in bowel habits, loss of appetite, bloody stools.  GU: No dysuria, change in color of urine, urgency or frequency.  No flank  pain, no hematuria  Skin: No rash, lesions, ulcerations MSK:  No joint pain or swelling.  No decreased range of motion.  No back pain. Neuro: No dizziness or lightheadedness.  Psych: No depression or anxiety. Mood stable.     Physical Exam:  BP 122/68 (BP Location: Left Arm, Patient Position: Sitting, Cuff Size: Normal)   Pulse 65   Temp 98.3 F (36.8 C) (Oral)   Ht 6' (1.829 m)   Wt 176 lb 9.6 oz (80.1 kg)   SpO2 97% Comment: on RA  BMI 23.95 kg/m   GEN: Pleasant, interactive, well-nourished; in no acute distress. HEENT:  Normocephalic and atraumatic. EACs patent bilaterally. TM pearly gray with present light reflex bilaterally. PERRLA. Sclera white. Nasal turbinates pink, moist and patent bilaterally. No rhinorrhea present. Oropharynx pink and moist, without exudate or edema. No lesions, ulcerations, or postnasal drip.  NECK:  Supple w/ fair ROM. No JVD present. Normal  carotid impulses w/o bruits. Thyroid symmetrical with no goiter or nodules palpated. No lymphadenopathy.   CV: RRR, no m/r/g, no peripheral edema. Pulses intact, +2 bilaterally. No cyanosis, pallor or clubbing. PULMONARY:  Unlabored, regular breathing. Clear bilaterally A&P w/o wheezes/rales/rhonchi. No accessory muscle use. No dullness to percussion. GI: BS present and normoactive. Soft, non-tender to palpation. No organomegaly or masses detected. No CVA tenderness. MSK: No erythema, warmth or tenderness. Cap refil <2 sec all extrem. No deformities or joint swelling noted.  Neuro: A/Ox3. No focal deficits noted.   Skin: Warm, no lesions or rashe Psych: Normal affect and behavior. Judgement and thought content appropriate.     Lab Results:  CBC    Component Value Date/Time   WBC 7.5 11/09/2020 0237   RBC 4.84 11/09/2020 0237   HGB 13.3 11/09/2020 0237   HCT 41.4 11/09/2020 0237   PLT 302 11/09/2020 0237   MCV 85.5 11/09/2020 0237   MCH 27.5 11/09/2020 0237   MCHC 32.1 11/09/2020 0237   RDW 14.6  11/09/2020 0237   LYMPHSABS 0.7 08/08/2016 1031   MONOABS 0.3 08/08/2016 1031   EOSABS 0.8 (H) 08/08/2016 1031   BASOSABS 0.0 08/08/2016 1031    BMET    Component Value Date/Time   NA 138 11/09/2020 0237   K 4.1 11/09/2020 0237   CL 101 11/09/2020 0237   CO2 30 11/09/2020 0237   GLUCOSE 120 (H) 11/09/2020 0237   BUN 15 11/09/2020 0237   CREATININE 1.00 11/09/2020 0237   CALCIUM 9.2 11/09/2020 0237   GFRNONAA >60 11/09/2020 0237   GFRAA >60 08/08/2016 1031    BNP No results found for: BNP   Imaging:  No results found.    PFT Results Latest Ref Rng & Units 10/07/2017 03/11/2017  FVC-Pre L 3.15 1.31  FVC-Predicted Pre % 78 32  FVC-Post L 3.21 -  FVC-Predicted Post % 79 -  Pre FEV1/FVC % % 67 40  Post FEV1/FCV % % 69 -  FEV1-Pre L 2.11 0.52  FEV1-Predicted Pre % 70 17  FEV1-Post L 2.22 -  DLCO uncorrected ml/min/mmHg 21.32 -  DLCO UNC% % 60 -  DLCO corrected ml/min/mmHg 23.68 -  DLCO COR %Predicted % 67 -  DLVA Predicted % 95 -  TLC L 6.21 -  TLC % Predicted % 83 -  RV % Predicted % 111 -    No results found for: NITRICOXIDE      Assessment & Plan:   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 Significant improvement. Well-controlled on ICS/LABA nebs. Has PRN duoneb and rescue inhaler available. No LAMA therapy currently d/t prostate. Continue singulair and flonase.   Patient Instructions  -Continue budesonide (pulmicort) 48mL nebulizer Twice daily, brush tongue and rinse mouth afterwards -Continue formoterol (Perforomist) 2 mL nebulizer Twice daily  -Continue Duoneb (ipatropium/albuterol) 11mL nebulizer to AS NEEDED every 4 hours for shortness of breath or wheezing -Continue albuterol inhaler 1-2 puffs every 6 hours AS NEEDED for shortness of breath or wheezing -Continue flonase nasal spray 1-2 sprays each nostril daily -Continue Singulair 10 mg At bedtime   Notify if worsening breathlessness, cough, mucus production, fatigue, or wheezing occurs.   Maintain up to date vaccinations, including influenza, COVID, and pneumococcal.  Wash your hands often and avoid sick exposures.  Encouraged masking in crowds.  Smoking cessation strongly encouraged and discussed.  Avoid triggers, when possible.  Exercise, as tolerated. Notify if worsening symptoms upon exertion occur.    Follow up in 3 months with  Dr. Melvyn Novas or APP. If symptoms do not improve or worsen, please contact office for sooner follow up or seek emergency care.        Clayton Bibles, NP 03/17/2021  Pt aware and understands NP's role.

## 2021-04-09 ENCOUNTER — Encounter: Payer: Self-pay | Admitting: Sports Medicine

## 2021-04-09 ENCOUNTER — Ambulatory Visit (INDEPENDENT_AMBULATORY_CARE_PROVIDER_SITE_OTHER): Payer: Commercial Managed Care - HMO | Admitting: Sports Medicine

## 2021-04-09 ENCOUNTER — Other Ambulatory Visit: Payer: Self-pay

## 2021-04-09 DIAGNOSIS — M79674 Pain in right toe(s): Secondary | ICD-10-CM

## 2021-04-09 DIAGNOSIS — M79675 Pain in left toe(s): Secondary | ICD-10-CM

## 2021-04-09 DIAGNOSIS — M21619 Bunion of unspecified foot: Secondary | ICD-10-CM

## 2021-04-09 DIAGNOSIS — B351 Tinea unguium: Secondary | ICD-10-CM | POA: Diagnosis not present

## 2021-04-09 DIAGNOSIS — G35 Multiple sclerosis: Secondary | ICD-10-CM

## 2021-04-09 NOTE — Progress Notes (Addendum)
Subjective:  Anthony Duffy is a 81 y.o. male patient seen today in office with complaint of mildly painful thickened and elongated toenails; unable to trim. Patient  have MS.  Denies any changes with meds or health since last encounter.   Patient Active Problem List   Diagnosis Date Noted   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 01/20/2017   Essential hypertension 01/20/2017   Multiple sclerosis ([place]) 02/19/2015   Malignant neoplasm of prostate ([place]) 12/03/2014    Current Outpatient Medications on File Prior to Visit  Medication Sig Dispense Refill   Accu-Chek FastClix Lancets MISC BLOOD GLUCOSE TESTING FASTING ALTERNATING WITH TWO HOURS AFTER SUPPER     albuterol (VENTOLIN HFA) 108 (90 Base) MCG/ACT inhaler Inhale 1-2 puffs into the lungs every 6 (six) hours as needed for wheezing or shortness of breath. 18 g 2   amLODipine (NORVASC) 10 MG tablet Take 10 mg by mouth daily.     BAYER ASPIRIN PO Take 81 mg by mouth.     budesonide (PULMICORT) 0.25 MG/2ML nebulizer solution Take 2 mLs (0.25 mg total) by nebulization in the morning and at bedtime. 120 mL 11   Cholecalciferol 125 MCG (5000 UT) capsule Take 5,000 Units by mouth daily.     cyclobenzaprine (FLEXERIL) 10 MG tablet Take 1 tablet by mouth at bedtime as needed.  3   fluticasone (FLONASE) 50 MCG/ACT nasal spray Place 1 spray into both nostrils daily. 9.9 mL 2   formoterol (PERFOROMIST) 20 MCG/2ML nebulizer solution Take 2 mLs (20 mcg total) by nebulization 2 (two) times daily. 60 mL 11   gabapentin (NEURONTIN) 300 MG capsule 300 mg daily.     ipratropium-albuterol (DUONEB) 0.5-2.5 (3) MG/3ML SOLN Take 3 mLs by nebulization every 4 (four) hours as needed. 1 vial in neb every 6 hours as needed 360 mL 4   losartan-hydrochlorothiazide (HYZAAR) 50-12.5 MG tablet Take 1 tablet by mouth daily. 30 tablet 11   meloxicam (MOBIC) 15 MG tablet Take 15 mg by mouth daily.  4   metoprolol succinate (TOPROL-XL) 25 MG 24 hr tablet TAKE 1 TABLET  BY MOUTH EVERY DAY 90 tablet 0   montelukast (SINGULAIR) 10 MG tablet Take 10 mg by mouth at bedtime.     rosuvastatin (CRESTOR) 10 MG tablet Take 20 mg by mouth every evening.  2   tamsulosin (FLOMAX) 0.4 MG CAPS capsule Take 0.4 mg by mouth daily.  2   No current facility-administered medications on file prior to visit.    No Known Allergies  Objective: Physical Exam  General: Well developed, nourished, no acute distress, awake, alert and oriented x 3  Vascular: Dorsalis pedis artery 2/4 bilateral, Posterior tibial artery 1/4 bilateral, skin temperature warm to warm proximal to distal bilateral lower extremities, no varicosities, pedal hair present bilateral.  Neurological: Gross sensation present via light touch bilateral.   Dermatological: Skin is warm, dry, and supple bilateral, Nails 1-10 are tender, long, thick, and discolored with mild subungal debris, no webspace macerations present bilateral, no open lesions present bilateral, no callus/corns/hyperkeratotic tissue present bilateral. No signs of infection bilateral.  Musculoskeletal: Asymptomatic bunion and hammertoe boney deformities noted bilateral. Muscular strength within normal limits without painon range of motion.  There is limited first MPJ range of motion bilateral consistent with hallux rigidus.  No pain with calf compression bilateral.  Assessment and Plan:  Problem List Items Addressed This Visit       Nervous and Auditory   Multiple sclerosis ([place])  Other Visit Diagnoses     Pain due to onychomycosis of toenails of both feet    -  Primary   Bunion           -Examined patient.  -Discussed treatment options for painful mycotic nails. -Mechanically debrided and reduced all painful mycotic nails with sterile nail nipper and dremel nail file without incident. -Right second toe Dispensed to prevent rubbing of the second toe when in shoes instructed patient on proper use -Continue with good supportive shoes  daily for foot type like previous -Patient to return in 3 months for follow up evaluation or sooner if symptoms worsen.  Landis Martins, DPM

## 2021-05-18 ENCOUNTER — Ambulatory Visit (HOSPITAL_COMMUNITY)
Admission: EM | Admit: 2021-05-18 | Discharge: 2021-05-18 | Disposition: A | Discharge status: Home / Self Care | Payer: Medicare Other | Attending: Emergency Medicine | Admitting: Emergency Medicine

## 2021-05-18 ENCOUNTER — Other Ambulatory Visit: Payer: Self-pay

## 2021-05-18 ENCOUNTER — Encounter (HOSPITAL_COMMUNITY): Payer: Self-pay

## 2021-05-18 DIAGNOSIS — Z202 Contact with and (suspected) exposure to infections with a predominantly sexual mode of transmission: Secondary | ICD-10-CM

## 2021-05-18 MED ORDER — METRONIDAZOLE 500 MG PO TABS
ORAL_TABLET | ORAL | Status: AC
  Filled 2021-05-18: qty 4

## 2021-05-18 MED ORDER — METRONIDAZOLE 500 MG PO TABS
2000.0000 mg | ORAL_TABLET | Freq: Once | ORAL | Status: AC
  Administered 2021-05-18: 2000 mg via ORAL

## 2021-05-18 NOTE — Discharge Instructions (Addendum)
Based on the history that you provided to me today, you were treated empirically for trichomonas with a single dose of metronidazole 2 g.  Please abstain from sexual intercourse for 7 days.   The results of your STD testing today will be made available to you once they are complete, this typically takes 3 to 5 days.  They will initially be posted to your MyChart and, if any of your results are abnormal, you will receive a phone call with those results along with further instructions regarding any further treatment, if needed.    Please remember that the only way to prevent transmission of sexually transmitted disease when having sexual intercourse is to use condoms.     Thank you for visiting urgent care today.  I appreciate the opportunity to participate in your care.

## 2021-05-18 NOTE — ED Triage Notes (Signed)
Pt presents for STD testing with no known symptoms; partner was recently treated for trich

## 2021-05-18 NOTE — ED Provider Notes (Signed)
[facility]    CSN: [id number] Arrival date & time: 05/18/21  1026    HISTORY   Chief Complaint  Patient presents with   Exposure to STD   HPI KRISH BAILLY is a 81 y.o. male. Patient presents to urgent care today requesting STD testing.  Patient denies currently having any symptoms, states his partner was recently treated for trichomoniasis.  Patient denies penile discharge, genital lesion, inguinal pain, increased frequency of urination, burning with urination, scrotal pain, scrotal swelling, testicular pain, testicular swelling, fever, aches, chills, nausea, vomiting, diarrhea, abdominal pain.  The history is provided by the patient.  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 01/20/2017   Essential hypertension 01/20/2017   Multiple sclerosis ([place]) 02/19/2015   Malignant neoplasm of prostate ([place]) 12/03/2014   Past Surgical History:  Procedure Laterality Date   CARDIAC SURGERY     CATARACT EXTRACTION W/PHACO  01/05/2012   Procedure: CATARACT EXTRACTION PHACO AND INTRAOCULAR LENS PLACEMENT ([place]);  Surgeon: Adonis Brook, MD;  Location: [facility];  Service: Ophthalmology;  Laterality: Right;   FINGER SURGERY     s/p injury 30 years ago   PROSTATE BIOPSY  10/14/2014    Home Medications    Prior to Admission medications   Medication Sig Start Date End Date Taking? Authorizing Provider  Accu-Chek FastClix Lancets MISC BLOOD GLUCOSE TESTING FASTING ALTERNATING WITH TWO HOURS AFTER SUPPER 07/14/18   [provider]  albuterol (VENTOLIN HFA) 108 (90 Base) MCG/ACT inhaler Inhale 1-2 puffs into the lungs every 6 (six) hours as needed for wheezing or shortness of breath. 02/18/21   Tanda Rockers, MD  amLODipine  (NORVASC) 10 MG tablet Take 10 mg by mouth daily.    [provider]  BAYER ASPIRIN PO Take 81 mg by mouth.    [provider]  budesonide (PULMICORT) 0.25 MG/2ML nebulizer solution Take 2 mLs (0.25 mg total) by nebulization in the morning and at bedtime. 02/17/21   Tanda Rockers, MD  Cholecalciferol 125 MCG (5000 UT) capsule Take 5,000 Units by mouth daily.    [provider]  cyclobenzaprine (FLEXERIL) 10 MG tablet Take 1 tablet by mouth at bedtime as needed. 06/10/17   [provider]  fluticasone (FLONASE) 50 MCG/ACT nasal spray Place 1 spray into both nostrils daily. 06/20/20   Lannie Fields, PA-C  formoterol (PERFOROMIST) 20 MCG/2ML nebulizer solution Take 2 mLs (20 mcg total) by nebulization 2 (two) times daily. 02/25/21   Tanda Rockers, MD  gabapentin (NEURONTIN) 300 MG capsule 300 mg daily. 04/22/15   [provider]  ipratropium-albuterol (DUONEB) 0.5-2.5 (3) MG/3ML SOLN Take 3 mLs by nebulization every 4 (four) hours as needed. 1 vial in neb every 6 hours as needed 02/18/21   Tanda Rockers, MD  losartan-hydrochlorothiazide (HYZAAR) 50-12.5 MG tablet Take 1 tablet by mouth daily. 01/20/17   Tanda Rockers, MD  meloxicam (MOBIC) 15 MG tablet Take 15 mg by mouth daily. 05/13/17   [provider]  metoprolol succinate (TOPROL-XL) 25 MG 24 hr tablet TAKE 1 TABLET BY MOUTH EVERY DAY 06/16/20   Patwardhan, Manish J, MD  montelukast (SINGULAIR) 10 MG tablet Take  10 mg by mouth at bedtime.    [provider]  rosuvastatin (CRESTOR) 10 MG tablet Take 20 mg by mouth every evening. 05/09/17   [provider]  tamsulosin (FLOMAX) 0.4 MG CAPS capsule Take 0.4 mg by mouth daily. 05/27/15   [provider]   Family History Family History  Problem Relation Age of Onset   Cancer Mother        unknown   Cancer Cousin        unknown   Social History Social History   Tobacco Use   Smoking status: Former    Packs/day:  0.50    Years: 30.00    Pack years: 15.00    Types: Cigarettes    Quit date: 04/05/1992    Years since quitting: 29.1   Smokeless tobacco: Never  Vaping Use   Vaping Use: Never used  Substance Use Topics   Alcohol use: No    Comment: stopped 15 years ago   Drug use: No   Allergies   Patient has no known allergies.  Review of Systems Review of Systems Pertinent findings noted in history of present illness.   Physical Exam Triage Vital Signs ED Triage Vitals  Enc Vitals Group     BP 01/30/21 0827 (!) 147/82     Pulse Rate 01/30/21 0827 72     Resp 01/30/21 0827 18     Temp 01/30/21 0827 98.3 F (36.8 C)     Temp Source 01/30/21 0827 Oral     SpO2 01/30/21 0827 98 %     Weight --      Height --      Head Circumference --      Peak Flow --      Pain Score 01/30/21 0826 5     Pain Loc --      Pain Edu? --      Excl. in [place]? --   No data found.  Updated Vital Signs BP (!) 160/75 (BP Location: Right Arm)    Pulse 80    Temp 98 F (36.7 C) (Oral)    Resp 17    SpO2 98%   Physical Exam Vitals and nursing note reviewed.  Constitutional:      General: He is not in acute distress.    Appearance: Normal appearance. He is not ill-appearing.  HENT:     Head: Normocephalic and atraumatic.  Eyes:     General: Lids are normal.        Right eye: No discharge.        Left eye: No discharge.     Extraocular Movements: Extraocular movements intact.     Conjunctiva/sclera: Conjunctivae normal.     Right eye: Right conjunctiva is not injected.     Left eye: Left conjunctiva is not injected.  Neck:     Trachea: Trachea and phonation normal.  Cardiovascular:     Rate and Rhythm: Normal rate and regular rhythm.     Pulses: Normal pulses.     Heart sounds: Normal heart sounds. No murmur heard.   No friction rub. No gallop.  Pulmonary:     Effort: Pulmonary effort is normal. No accessory muscle usage, prolonged expiration or respiratory distress.     Breath sounds: Normal breath  sounds. No stridor, decreased air movement or transmitted upper airway sounds. No decreased breath sounds, wheezing, rhonchi or rales.  Chest:     Chest wall: No tenderness.  Genitourinary:    Comments: Pt politely declines  GU exam, pt did provide a penile swab for testing.   Musculoskeletal:        General: Normal range of motion.     Cervical back: Normal range of motion and neck supple. Normal range of motion.  Lymphadenopathy:     Cervical: No cervical adenopathy.  Skin:    General: Skin is warm and dry.     Findings: No erythema or rash.  Neurological:     General: No focal deficit present.     Mental Status: He is alert and oriented to person, place, and time.  Psychiatric:        Mood and Affect: Mood normal.        Behavior: Behavior normal.    Visual Acuity Right Eye Distance:   Left Eye Distance:   Bilateral Distance:    Right Eye Near:   Left Eye Near:    Bilateral Near:     UC Couse / Diagnostics / Procedures:    EKG  Radiology No results found.  Procedures Procedures (including critical care time)  UC Diagnoses / Final Clinical Impressions(s)   I have reviewed the triage vital signs and the nursing notes.  Pertinent labs & imaging results that were available during my care of the patient were reviewed by me and considered in my medical decision making (see chart for details).    Final diagnoses:  STD exposure   Patient was provided with Metronidazole 2000 mg for empiric treatment of presumed trichomonas based on the history provided to me today. Patient was advised to abstain from sexual intercourse for the next 7 days while being treated.  Patient was also advised to use condoms to protect themselves from STD exposure. STD screening was performed, patient advised that the results be posted to their MyChart and if any of the results are positive, they will be notified by phone, further treatment will be provided as indicated based on results of STD  screening. Return precautions advised.  Drug allergies reviewed, all questions addressed.     ED Prescriptions   None    PDMP not reviewed this encounter.  Pending results:  Labs Reviewed  CYTOLOGY, (ORAL, ANAL, URETHRAL) ANCILLARY ONLY    Medications Ordered in UC: Medications  metroNIDAZOLE (FLAGYL) tablet 2,000 mg (has no administration in time range)    Disposition Upon Discharge:  Condition: stable for discharge home  Patient presented with concern for an acute illness with associated systemic symptoms and significant discomfort requiring urgent management. In my opinion, this is a condition that a prudent lay person (someone who possesses an average knowledge of health and medicine) may potentially expect to result in complications if not addressed urgently such as respiratory distress, impairment of bodily function or dysfunction of bodily organs.   As such, the patient has been evaluated and assessed, work-up was performed and treatment was provided in alignment with urgent care protocols and evidence based medicine.  Patient/parent/caregiver has been advised that the patient may require follow up for further testing and/or treatment if the symptoms continue in spite of treatment, as clinically indicated and appropriate.  Routine symptom specific, illness specific and/or disease specific instructions were discussed with the patient and/or caregiver at length.  Prevention strategies for avoiding STD exposure were also discussed.  The patient will follow up with their current PCP if and as advised. If the patient does not currently have a PCP we will assist them in obtaining one.   The patient may need specialty follow up if the  symptoms continue, in spite of conservative treatment and management, for further workup, evaluation, consultation and treatment as clinically indicated and appropriate.  Patient/parent/caregiver verbalized understanding and agreement of plan as  discussed.  All questions were addressed during visit.  Please see discharge instructions below for further details of plan.  Discharge Instructions:   Discharge Instructions      Based on the history that you provided to me today, you were treated empirically for trichomonas with a single dose of metronidazole 2 g.  Please abstain from sexual intercourse for 7 days.   The results of your STD testing today will be made available to you once they are complete, this typically takes 3 to 5 days.  They will initially be posted to your MyChart and, if any of your results are abnormal, you will receive a phone call with those results along with further instructions regarding any further treatment, if needed.    Please remember that the only way to prevent transmission of sexually transmitted disease when having sexual intercourse is to use condoms.     Thank you for visiting urgent care today.  I appreciate the opportunity to participate in your care.     This office note has been dictated using Museum/gallery curator.  Unfortunately, and despite my best efforts, this method of dictation can sometimes lead to occasional typographical or grammatical errors.  I apologize in advance if this occurs.      Lynden Oxford Scales, PA-C 05/18/21 1148

## 2021-05-19 LAB — CYTOLOGY, (ORAL, ANAL, URETHRAL) ANCILLARY ONLY
Chlamydia: NEGATIVE
Comment: NEGATIVE
Comment: NEGATIVE
Comment: NORMAL
Neisseria Gonorrhea: NEGATIVE
Trichomonas: POSITIVE — AB

## 2021-05-31 ENCOUNTER — Other Ambulatory Visit: Payer: Self-pay | Admitting: Internal Medicine

## 2021-06-15 ENCOUNTER — Other Ambulatory Visit: Payer: Self-pay

## 2021-06-15 ENCOUNTER — Encounter: Payer: Self-pay | Admitting: Nurse Practitioner

## 2021-06-15 ENCOUNTER — Ambulatory Visit (INDEPENDENT_AMBULATORY_CARE_PROVIDER_SITE_OTHER): Payer: Medicare Other | Admitting: Nurse Practitioner

## 2021-06-15 DIAGNOSIS — J449 Chronic obstructive pulmonary disease, unspecified: Secondary | ICD-10-CM

## 2021-06-15 DIAGNOSIS — I10 Essential (primary) hypertension: Secondary | ICD-10-CM

## 2021-06-15 MED ORDER — ALBUTEROL SULFATE HFA 108 (90 BASE) MCG/ACT IN AERS: 1.0000 | INHALATION_SPRAY | Freq: Four times a day (QID) | RESPIRATORY_TRACT | 2 refills | Status: AC | PRN

## 2021-06-15 NOTE — Progress Notes (Signed)
$'@Patient'J$  ID: Anthony Duffy, male    DOB: 1940-06-11, 81 y.o.   MRN: [id number]  Chief Complaint  Patient presents with   Follow-up    Better since starting the performist.    Referring provider: No ref. provider found  HPI: 81 year old male, former smoker (15 pack year hx) followed for COPD Gold II. He is a patient of Dr. Gustavus Bryant and last seen in office 03/17/2022 by Chandelle Harkey,NP. Past medical history significant for HTN, MS, and prostate cancer.   TEST/EVENTS:  10/07/2017 PFTs: FVC 3.21 (79), FEV1 2.22 (74), ratio 69, TLC 83%, DLCO corrected 67% 10/15/2020 echocardiogram: EF 60 to 65%.  G1 DD.  Mildly elevated PASP.  Mild MR.  Mild calcification of AV without stenosis.  IVC dilated in size with greater than 50% respiratory variability 12/01/2020 CXR 2 view: Mild hyperinflation and flattened hemidiaphragms compatible with COPD/emphysema.  No acute process noted.  There was stable hyperinflation noted  04/10/2020: OV with Dr. Melvyn Novas.  Rx Breztri or Symbicort (1 or the other) twice daily.  Albuterol as needed.  Difficulties with understanding medications and proper usage per note.  02/17/2021: OV with Dr. Melvyn Novas.  Difficulties with proper inhaler use.  No LAMA therapy due to prostate issues.  Switch to performing 0.25 mg and albuterol nebs.  Follow-up 4 weeks  03/17/2021: OV with Deshan Hemmelgarn NP for follow-up.  Reports that breathing is significantly improved and he feels much better with nebulized medicines.  Well-controlled on ICS/LABA nebs.  Continued Singulair and Flonase.  Follow-up in 3 months.  06/15/2021: Today - follow up Patient presents today for follow up. His breathing is doing great and he feels well. He has not had any issues since transitioning to his nebulized treatments. He has rarely required his rescue nebs or inhaler. He takes singulair at night and continues to use flonase. No concerns or complaints today.   No Known Allergies  Immunization History  Administered Date(s) Administered    Fluad Quad(high Dose 65+) 01/28/2021   Influenza, High Dose Seasonal PF 01/20/2017, 12/04/2018   PFIZER(Purple Top)SARS-COV-2 Vaccination 04/30/2019, 05/21/2019    Past Medical History:  Diagnosis Date   Arthritis    Asthma    as child   Bronchitis    COPD (chronic obstructive pulmonary disease) ([place])    Hypertension    Prostate cancer ([place]) 10/14/2014    Tobacco History: Social History   Tobacco Use  Smoking Status Former   Packs/day: 0.50   Years: 30.00   Pack years: 15.00   Types: Cigarettes   Quit date: 04/05/1992   Years since quitting: 29.2  Smokeless Tobacco Never   Counseling given: Not Answered   Outpatient Medications Prior to Visit  Medication Sig Dispense Refill   Accu-Chek FastClix Lancets MISC BLOOD GLUCOSE TESTING FASTING ALTERNATING WITH TWO HOURS AFTER SUPPER     amLODipine (NORVASC) 10 MG tablet Take 10 mg by mouth daily.     BAYER ASPIRIN PO Take 81 mg by mouth.     budesonide (PULMICORT) 0.25 MG/2ML nebulizer solution Take 2 mLs (0.25 mg total) by nebulization in the morning and at bedtime. 120 mL 11   Cholecalciferol 125 MCG (5000 UT) capsule Take 5,000 Units by mouth daily.     cyclobenzaprine (FLEXERIL) 10 MG tablet Take 1 tablet by mouth at bedtime as needed.  3   fluticasone (FLONASE) 50 MCG/ACT nasal spray Place 1 spray into both nostrils daily. 9.9 mL 2   formoterol (PERFOROMIST) 20 MCG/2ML nebulizer solution Take 2 mLs (  20 mcg total) by nebulization 2 (two) times daily. 60 mL 11   gabapentin (NEURONTIN) 300 MG capsule 300 mg daily.     ipratropium-albuterol (DUONEB) 0.5-2.5 (3) MG/3ML SOLN Take 3 mLs by nebulization every 4 (four) hours as needed. 1 vial in neb every 6 hours as needed 360 mL 4   losartan-hydrochlorothiazide (HYZAAR) 50-12.5 MG tablet Take 1 tablet by mouth daily. 30 tablet 11   meloxicam (MOBIC) 15 MG tablet Take 15 mg by mouth daily.  4   metoprolol succinate (TOPROL-XL) 25 MG 24 hr tablet TAKE 1 TABLET BY MOUTH EVERY DAY 90  tablet 0   montelukast (SINGULAIR) 10 MG tablet Take 10 mg by mouth at bedtime.     Nebulizers (SIDESTREAM PLUS NEBULIZER) MISC Use with nebulized medications as directed. Replace tubing every 6 months. 1 each 2   rosuvastatin (CRESTOR) 10 MG tablet Take 20 mg by mouth every evening.  2   tamsulosin (FLOMAX) 0.4 MG CAPS capsule Take 0.4 mg by mouth daily.  2   albuterol (VENTOLIN HFA) 108 (90 Base) MCG/ACT inhaler Inhale 1-2 puffs into the lungs every 6 (six) hours as needed for wheezing or shortness of breath. 18 g 2   No facility-administered medications prior to visit.     Review of Systems:   Constitutional: No weight loss or gain, night sweats, fevers, chills, fatigue, or lassitude. HEENT: No headaches, difficulty swallowing, tooth/dental problems, or sore throat. No sneezing, itching, ear ache, nasal congestion, or post nasal drip CV:  No chest pain, orthopnea, PND, swelling in lower extremities, anasarca, dizziness, palpitations, syncope Resp: +shortness of breath with exertion (only with rigorous activity; at baseline). No excess mucus or change in color of mucus. No productive or non-productive. No hemoptysis. No wheezing.  No chest wall deformity GI:  No heartburn, indigestion, abdominal pain, nausea, vomiting, diarrhea, change in bowel habits, loss of appetite, bloody stools.  GU: No dysuria, change in color of urine, urgency or frequency.  No flank pain, no hematuria  Skin: No rash, lesions, ulcerations MSK:  No joint pain or swelling.  No decreased range of motion.  No back pain. Neuro: No dizziness or lightheadedness.  Psych: No depression or anxiety. Mood stable.     Physical Exam:  BP (!) 110/58 (BP Location: Left Arm, Patient Position: Sitting, Cuff Size: Normal)    Pulse 80    Temp (!) 97.5 F (36.4 C) (Oral)    Ht 6' (1.829 m)    Wt 180 lb 9.6 oz (81.9 kg)    SpO2 100%    BMI 24.49 kg/m   GEN: Pleasant, interactive, well-appearing; in no acute distress. HEENT:   Normocephalic and atraumatic. PERRLA. Sclera white. Nasal turbinates pink, moist and patent bilaterally. No rhinorrhea present. Oropharynx pink and moist, without exudate or edema. No lesions, ulcerations, or postnasal drip.  NECK:  Supple w/ fair ROM. No JVD present. Normal carotid impulses w/o bruits. Thyroid symmetrical with no goiter or nodules palpated. No lymphadenopathy.   CV: RRR, no m/r/g, no peripheral edema. Pulses intact, +2 bilaterally. No cyanosis, pallor or clubbing. PULMONARY:  Unlabored, regular breathing. Clear bilaterally A&P w/o wheezes/rales/rhonchi. No accessory muscle use. No dullness to percussion. GI: BS present and normoactive. Soft, non-tender to palpation. No organomegaly or masses detected.  MSK: No erythema, warmth or tenderness. Cap refil <2 sec all extrem. No deformities or joint swelling noted.  Neuro: A/Ox3. No focal deficits noted.   Skin: Warm, no lesions or rashe Psych: Normal affect and behavior.  Judgement and thought content appropriate.     Lab Results:  CBC    Component Value Date/Time   WBC 7.5 11/09/2020 0237   RBC 4.84 11/09/2020 0237   HGB 13.3 11/09/2020 0237   HCT 41.4 11/09/2020 0237   PLT 302 11/09/2020 0237   MCV 85.5 11/09/2020 0237   MCH 27.5 11/09/2020 0237   MCHC 32.1 11/09/2020 0237   RDW 14.6 11/09/2020 0237   LYMPHSABS 0.7 08/08/2016 1031   MONOABS 0.3 08/08/2016 1031   EOSABS 0.8 (H) 08/08/2016 1031   BASOSABS 0.0 08/08/2016 1031    BMET    Component Value Date/Time   NA 138 11/09/2020 0237   K 4.1 11/09/2020 0237   CL 101 11/09/2020 0237   CO2 30 11/09/2020 0237   GLUCOSE 120 (H) 11/09/2020 0237   BUN 15 11/09/2020 0237   CREATININE 1.00 11/09/2020 0237   CALCIUM 9.2 11/09/2020 0237   GFRNONAA >60 11/09/2020 0237   GFRAA >60 08/08/2016 1031    BNP No results found for: BNP   Imaging:  No results found.    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 Has PRN duoneb and rescue inhaler available. No LAMA therapy currently d/t prostate. Continue singulair and flonase.   Patient Instructions  -Continue budesonide (pulmicort) 67m nebulizer Twice daily, brush tongue and rinse mouth afterwards -Continue formoterol (Perforomist) 2 mL nebulizer Twice daily  -Continue Duoneb (ipatropium/albuterol) 339mnebulizer to AS NEEDED every 4 hours for shortness of breath or wheezing -Continue albuterol inhaler 1-2 puffs every 6 hours AS NEEDED for shortness of breath or wheezing -Continue flonase nasal spray 1-2 sprays each nostril daily -Continue Singulair 10 mg At bedtime    Notify if worsening breathlessness, cough, mucus production, fatigue, or wheezing occurs.  Maintain up to date vaccinations, including influenza, COVID, and pneumococcal.  Wash your hands often and avoid sick exposures.  Encouraged masking in crowds.  Smoking cessation strongly encouraged and discussed.  Avoid triggers, when possible.  Exercise, as tolerated. Notify if worsening symptoms upon exertion occur.     Follow up in 4-6 months with Dr. WeMelvyn Novasr APP. If symptoms do not improve or worsen, please contact office for sooner follow up or seek emergency care.    Essential hypertension Well-controlled at OV on current antihypertensive regimen. Follow up with PCP as scheduled.    I spent 28 minutes of dedicated to the  care of this patient on the date of this encounter to include pre-visit review of records, face-to-face time with the patient discussing conditions above, post visit ordering of testing, clinical documentation with the electronic health record, making appropriate referrals as documented, and communicating necessary findings to members of the patients  care team.  Clayton Bibles, NP 06/15/2021  Pt aware and understands NP's role.

## 2021-06-15 NOTE — Assessment & Plan Note (Signed)
Well-controlled at OV on current antihypertensive regimen. Follow up with PCP as scheduled.  ?

## 2021-06-15 NOTE — Assessment & Plan Note (Signed)
Well-controlled on ICS/LABA nebs without any recent exacerbations. Has PRN duoneb and rescue inhaler available. No LAMA therapy currently d/t prostate. Continue singulair and flonase.  ? ?Patient Instructions  ?-Continue budesonide (pulmicort) 67m nebulizer Twice daily, brush tongue and rinse mouth afterwards ?-Continue formoterol (Perforomist) 2 mL nebulizer Twice daily  ?-Continue Duoneb (ipatropium/albuterol) 380mnebulizer to AS NEEDED every 4 hours for shortness of breath or wheezing ?-Continue albuterol inhaler 1-2 puffs every 6 hours AS NEEDED for shortness of breath or wheezing ?-Continue flonase nasal spray 1-2 sprays each nostril daily ?-Continue Singulair 10 mg At bedtime  ?  ?Notify if worsening breathlessness, cough, mucus production, fatigue, or wheezing occurs.  ?Maintain up to date vaccinations, including influenza, COVID, and pneumococcal.  ?Wash your hands often and avoid sick exposures.  ?Encouraged masking in crowds.  ?Smoking cessation strongly encouraged and discussed.  ?Avoid triggers, when possible.  ?Exercise, as tolerated. Notify if worsening symptoms upon exertion occur.   ?  ?Follow up in 4-6 months with Dr. WeMelvyn Novasr APP. If symptoms do not improve or worsen, please contact office for sooner follow up or seek emergency care. ? ? ?

## 2021-06-15 NOTE — Patient Instructions (Addendum)
-  Continue budesonide (pulmicort) 66m nebulizer Twice daily, brush tongue and rinse mouth afterwards ?-Continue formoterol (Perforomist) 2 mL nebulizer Twice daily  ?-Continue Duoneb (ipatropium/albuterol) 313mnebulizer to AS NEEDED every 4 hours for shortness of breath or wheezing ?-Continue albuterol inhaler 1-2 puffs every 6 hours AS NEEDED for shortness of breath or wheezing ?-Continue flonase nasal spray 1-2 sprays each nostril daily ?-Continue Singulair 10 mg At bedtime  ?  ?Notify if worsening breathlessness, cough, mucus production, fatigue, or wheezing occurs.  ?Maintain up to date vaccinations, including influenza, COVID, and pneumococcal.  ?Wash your hands often and avoid sick exposures.  ?Encouraged masking in crowds.  ?Smoking cessation strongly encouraged and discussed.  ?Avoid triggers, when possible.  ?Exercise, as tolerated. Notify if worsening symptoms upon exertion occur.   ?  ?Follow up in 4-6 months with Dr. WeMelvyn Novasr APP. If symptoms do not improve or worsen, please contact office for sooner follow up or seek emergency care. ?

## 2021-07-16 ENCOUNTER — Encounter: Payer: Self-pay | Admitting: Sports Medicine

## 2021-07-16 ENCOUNTER — Ambulatory Visit (INDEPENDENT_AMBULATORY_CARE_PROVIDER_SITE_OTHER): Payer: Medicare Other | Admitting: Sports Medicine

## 2021-07-16 DIAGNOSIS — M21619 Bunion of unspecified foot: Secondary | ICD-10-CM

## 2021-07-16 DIAGNOSIS — M79674 Pain in right toe(s): Secondary | ICD-10-CM

## 2021-07-16 DIAGNOSIS — M79675 Pain in left toe(s): Secondary | ICD-10-CM | POA: Diagnosis not present

## 2021-07-16 DIAGNOSIS — M2041 Other hammer toe(s) (acquired), right foot: Secondary | ICD-10-CM

## 2021-07-16 DIAGNOSIS — B351 Tinea unguium: Secondary | ICD-10-CM | POA: Diagnosis not present

## 2021-07-16 DIAGNOSIS — M2042 Other hammer toe(s) (acquired), left foot: Secondary | ICD-10-CM

## 2021-07-16 DIAGNOSIS — G35 Multiple sclerosis: Secondary | ICD-10-CM

## 2021-07-16 NOTE — Progress Notes (Signed)
Subjective:  [name] is a 81 y.o. male patient seen today in office with complaint of mildly painful thickened and elongated toenails; unable to trim. Patient  have MS.  Denies any changes with meds or health since last encounter.  No other concerns noted. ? ? ?Patient Active Problem List  ? Diagnosis Date Noted  ? 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 Essential hypertension 01/20/2017  ? Multiple sclerosis ([place]) 02/19/2015  ? Malignant neoplasm of prostate ([place]) 12/03/2014  ? ? ?Current Outpatient Medications on File Prior to Visit  ?Medication Sig Dispense Refill  ? Accu-Chek FastClix Lancets MISC BLOOD GLUCOSE TESTING FASTING ALTERNATING WITH TWO HOURS AFTER SUPPER    ? albuterol (VENTOLIN HFA) 108 (90 Base) MCG/ACT inhaler Inhale 1-2 puffs into the lungs every 6 (six) hours as needed for wheezing or shortness of breath. 18 g 2  ? amLODipine (NORVASC) 10 MG tablet Take 10 mg by mouth daily.    ? BAYER ASPIRIN PO Take 81 mg by mouth.    ? budesonide (PULMICORT) 0.25 MG/2ML nebulizer solution Take 2 mLs (0.25 mg total) by nebulization in the morning and at bedtime. 120 mL 11  ? chlorhexidine (PERIDEX) 0.12 % solution 2 (two) times daily.    ? Cholecalciferol 125 MCG (5000 UT) capsule Take 5,000 Units by mouth daily.    ? cyclobenzaprine (FLEXERIL) 10 MG tablet Take 1 tablet by mouth at bedtime as needed.  3  ? fluticasone (FLONASE) 50 MCG/ACT nasal spray Place 1 spray into both nostrils daily. 9.9 mL 2  ? formoterol (PERFOROMIST) 20 MCG/2ML nebulizer solution Take 2 mLs (20 mcg total) by nebulization 2 (two) times daily. 60 mL 11  ? gabapentin (NEURONTIN) 300 MG capsule 300 mg daily.    ? ibuprofen (ADVIL) 800 MG tablet Take 800 mg by mouth every 8 (eight) hours as needed.    ? ipratropium-albuterol (DUONEB) 0.5-2.5 (3) MG/3ML SOLN Take 3 mLs by nebulization every 4 (four) hours as needed. 1 vial in neb every 6 hours as needed 360 mL 4  ? losartan-hydrochlorothiazide (HYZAAR) 50-12.5 MG  tablet Take 1 tablet by mouth daily. 30 tablet 11  ? meloxicam (MOBIC) 15 MG tablet Take 15 mg by mouth daily.  4  ? metoprolol succinate (TOPROL-XL) 25 MG 24 hr tablet TAKE 1 TABLET BY MOUTH EVERY DAY 90 tablet 0  ? montelukast (SINGULAIR) 10 MG tablet Take 10 mg by mouth at bedtime.    ? Nebulizers (SIDESTREAM PLUS NEBULIZER) MISC Use with nebulized medications as directed. Replace tubing every 6 months. 1 each 2  ? rosuvastatin (CRESTOR) 10 MG tablet Take 20 mg by mouth every evening.  2  ? sildenafil (REVATIO) 20 MG tablet SMARTSIG:1-2 Tablet(s) By Mouth    ? tamsulosin (FLOMAX) 0.4 MG CAPS capsule Take 0.4 mg by mouth daily.  2  ? ?No current facility-administered medications on file prior to visit.  ? ? ?No Known Allergies ? ?Objective: ?Physical Exam ? ?General: Well developed, nourished, no acute distress, awake, alert and oriented x 3 ? ?Vascular: Dorsalis pedis artery 2/4 bilateral, Posterior tibial artery 1/4 bilateral, skin temperature warm to warm proximal to distal bilateral lower extremities, no varicosities, pedal hair present bilateral. ? ?Neurological: Gross sensation present via light touch bilateral.  ? ?Dermatological: Skin is warm, dry, and supple bilateral, Nails 1-10 are tender, long, thick, and discolored with mild subungal debris, no webspace macerations present bilateral, no open lesions present bilateral, no callus/corns/hyperkeratotic tissue present bilateral.  No signs of infection bilateral. ? ?Musculoskeletal: Asymptomatic bunion and hammertoe boney deformities noted bilateral. Muscular strength within normal limits without painon range of motion.  There is limited first MPJ range of motion bilateral consistent with hallux rigidus.  No pain with calf compression bilateral. ? ?Assessment and Plan:  ?Problem List Items Addressed This Visit   ? ?  ? Nervous and Auditory  ? Multiple sclerosis ([place])  ? ?Other Visit Diagnoses   ? ? Pain due to onychomycosis of toenails of both feet    -   Primary  ? Bunion      ? Hammer toes of both feet      ? ?  ? ? ?-Examined patient.  ?-Discussed treatment options for painful mycotic nails. ?-Mechanically debrided and reduced all painful mycotic nails x 10 with sterile nail nipper and dremel nail file without incident ?-Continue with good supportive shoes daily for foot type as previously recommended ?-Patient to return in 3 months for follow up evaluation or sooner if symptoms worsen. ? ?Landis Martins, DPM ? ?

## 2021-08-17 DIAGNOSIS — H59032 Cystoid macular edema following cataract surgery, left eye: Secondary | ICD-10-CM | POA: Insufficient documentation

## 2021-10-19 ENCOUNTER — Ambulatory Visit (INDEPENDENT_AMBULATORY_CARE_PROVIDER_SITE_OTHER): Payer: Medicare Other | Admitting: Podiatry

## 2021-10-19 ENCOUNTER — Encounter: Payer: Self-pay | Admitting: Podiatry

## 2021-10-19 DIAGNOSIS — L84 Corns and callosities: Secondary | ICD-10-CM

## 2021-10-19 DIAGNOSIS — M79675 Pain in left toe(s): Secondary | ICD-10-CM | POA: Diagnosis not present

## 2021-10-19 DIAGNOSIS — G35 Multiple sclerosis: Secondary | ICD-10-CM

## 2021-10-19 DIAGNOSIS — M79674 Pain in right toe(s): Secondary | ICD-10-CM

## 2021-10-19 DIAGNOSIS — B351 Tinea unguium: Secondary | ICD-10-CM | POA: Diagnosis not present

## 2021-10-24 NOTE — Progress Notes (Signed)
  Subjective:  Patient ID: ADIT RIDDLES, male    DOB: 1940/05/25,  MRN: [id number]  Anthony Duffy presents to clinic today for at risk foot care with h/o multiple sclerosis and neuropathy and corn(s) b/l lower extremities and painful thick toenails that are difficult to trim. Painful toenails interfere with ambulation. Aggravating factors include wearing enclosed shoe gear. Pain is relieved with periodic professional debridement. Painful corns are aggravated when weightbearing when wearing enclosed shoe gear. Pain is relieved with periodic professional debridement.  New problem(s): None.   PCP is Cipriano Mile, NP , and last visit was May, 2023.  No Known Allergies  Review of Systems: Negative except as noted in the HPI.  Objective: No changes noted in today's physical examination. Objective:   Vascular Examination: Vascular status intact b/l with palpable DP pulses and faintly palpable PT pulses. Pedal hair present b/l. CFT immediate b/l. No edema. No pain with calf compression b/l. Skin temperature gradient WNL b/l.   Neurological Examination: Sensation grossly intact b/l with 10 gram monofilament. Vibratory sensation intact b/l. Pt has subjective symptoms of neuropathy.  Dermatological Examination: Pedal skin with normal turgor, texture and tone b/l. Toenails 1-5 b/l thick, discolored, elongated with subungual debris and pain on dorsal palpation. Hyperkeratotic lesion(s) dorsal DIPJ of right 4th digit and left 5th digit.  No erythema, no edema, no drainage, no fluctuance.  Musculoskeletal Examination: Muscle strength 5/5 to b/l LE. Limited joint ROM to the 1st MPJ b/l. HAV with bunion bilaterally and hammertoes 2-5 b/l.  Radiographs: None  Assessment/Plan: 1. Pain due to onychomycosis of toenails of both feet   2. Corns   3. Multiple sclerosis ([place])     -Patient was evaluated and treated. All patient's and/or POA's questions/concerns answered on today's visit. -Medicaid ABN signed  for services of paring of corn(s)/callus(es)/porokeratos(es) today. Copy in patient chart. -Patient to continue soft, supportive shoe gear daily. -Toenails 1-5 b/l were debrided in length and girth with sterile nail nippers and dremel without iatrogenic bleeding.  -Corn(s) L 5th toe and R 4th toe pared utilizing sterile scalpel blade without complication or incident. Total number debrided=2. -Dispensed tube foam. Apply to L 5th toe and R 4th toe every morning. Remove every evening. -Patient/POA to call should there be question/concern in the interim.  Return in about 3 months (around 01/19/2022).  Marzetta Board, DPM

## 2021-12-25 ENCOUNTER — Encounter: Payer: Self-pay | Admitting: Internal Medicine

## 2021-12-25 ENCOUNTER — Ambulatory Visit (INDEPENDENT_AMBULATORY_CARE_PROVIDER_SITE_OTHER): Payer: Medicare Other | Admitting: Internal Medicine

## 2021-12-25 DIAGNOSIS — J449 Chronic obstructive pulmonary disease, unspecified: Secondary | ICD-10-CM

## 2021-12-25 NOTE — Progress Notes (Unsigned)
Subjective:     Patient ID: Anthony Duffy, male   DOB: 09/22/1940,    MRN: [id number]    Brief patient profile: 81`yobm quit smoking in 1994 referred to pulmonary clinic 01/20/2017 by [facility] for sob with GOLD II copd with marked reversibility documented 04/08/2017    History of Present Illness  01/20/2017 1st [facility] office visit/ Anthony Duffy   Chief Complaint  Patient presents with   Pulmonary Consult    Referred by [name]. Pt c/o SOB off and on for the past year. He states "I get tired real quick".   no trouble at rest sitting/ sometimes a little sob immediately on lying down like getting choked  Walks dog qod s sob / not regularly  In ER x 4 since 08/2016 with sob / "congestion" with white mucus  rec Stop lisnopril /HCTZ ( prinizide ) and start losartan 50-12.5 one daily and ok to double it if needed Plan A = Automatic =  Dulera (blue) 100 = Symbicort 80 Take 2 puffs first thing in am and then another 2 puffs about 12 hours later.  Work on inhaler technique:  Plan B = Backup Only use your albuterol(Proair)  Plan C = Crisis - only use your albuterol nebulizer if you first try Plan B   Please schedule a follow up office visit in 6 weeks with pfts  call sooner if needed with all medications /inhalers/ solutions in hand so we can verify exactly what you are taking. This includes all medications from all doctors and over the counters     03/11/2017  f/u ov/Breane Grunwald re: ? GOLD IV copd / confused with meds / did not bring as req  Chief Complaint  Patient presents with   Follow-up    Pt c/o increased SOB, wheezing and prod cough with white sputum for the past wk.  He states he feels like he is gasping for air, even wakes up in the night with this. He has been using his albuterol inhaler and neb at least 2 x per day.    Am of ov 6 h prior to OV  = neb / has dulera but it's on 0 and pt not sure when that occurred, still has sprays left and over the last week gradually worse sob  to point where sob at rest x after the neb rec duoneb x 1 treatment Prednisone 10 mg take  4 each am x 2 days,   2 each am x 2 days,  1 each am x 2 days and stop  Plan A = Automatic = Symbicort 160 Take 2 puffs first thing in am and then another 2 puffs about 12 hours later.  Work on inhaler technique:   Plan B = Backup Only use your albuterol Proair) as a rescue medication Plan C = Crisis - only use your albuterol/ipatropium nebulizer if you first try Plan B and it fails to help > ok to use the nebulizer up to every 4 hours but if start needing it regularly call for immediate appointment Please schedule a follow up office visit in 4 weeks, sooner if needed  with all medications /inhalers/ solutions in hand > did not do    04/10/2019  f/u ov/Anthony Duffy re: copd II Chief Complaint  Patient presents with   Follow-up    Pt states he has been feeling pretty good but breathing is a "little short". Pt denies any cough, wheezing, fever or chills.  Dyspnea:  MMRC1 = can walk  nl pace, flat grade, can't hurry or go uphills or steps s sob   Cough: no  Sleeping: ok  SABA use: rarely now  02: none  rec Plan A = Automatic = Always=    Bretri Take 2 puffs first thing in am and then another 2 puffs about 12 hours later.  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 He is using his albuterol inhaler 3 x per day on average.   Dyspnea:  MMRC1 = can walk nl pace, flat grade,  can't hurry or go uphills or steps s sob   Cough: none  Sleeping: sleep flat/one big pillow under head  SABA use: confused and using symbicort prn  02: none  Rec Plan A = Automatic = Always=    Breztri (or Symbicort 80 but not both) Take 2 puffs first thing in am and then another 2 puffs about 12 hours later.  Work on inhaler technique:   If having trouble with peeing change to Symbicort (RED)  Plan B = Backup (to supplement plan A, not to replace it) Only use your Albuterol inhaler as a rescue medication  Plan C = Crisis (instead of Plan B but only if Plan B stops working) - only use your albuterol nebulizer if you first try Plan B  Please schedule a follow up visit in 3 months but call sooner if needed  with all medications /inhalers/ solutions in hand so we can verify exactly what you are taking. This includes all medications from all doctors and over the counters        02/17/2021  f/u ov/Anthony Duffy re: GOLD 2    maint on ???  Does not know names/ only brought albuterol hfa and doesn't know how to use it Chief Complaint  Patient presents with   Follow-up    Increased sob and clear congestion for 2-3 months.  Dyspnea:  MMRC2 = can't walk a nl pace on a flat grade s sob but does fine slow and flat eg grocery shopping  Cough: clear mucus  Sleeping: worse cough at hs x sev months  SABA use: hfa and neb but very poor use of hva  02: none  Covid status:  says vax x  5  Rec Plan A = Automatic = Always=    Performist and Budesonide 0.25  twice daily  - 1st thing in am and 12 hours later Prednisone 10 mg take  4 each am x 2 days,   2 each am x 2 days,  1 each am x 2 days and stop  Plan B = Backup (to supplement plan A, not to replace it) Only use your albuterol inhaler as a rescue medication Work on inhaler technique:  Plan C = Crisis (instead of Plan B but only if Plan B stops working) - only use your albuterol nebulizer if you first  try Plan B  Please schedule a follow up office visit in  4 weeks, sooner if needed  with all medications /inhalers/ solutions in hand so we can verify exactly what you are taking. This includes all medications from all doctors and over the counters      12/25/2021  f/u ov/Anthony Duffy re: GOLD 2    maint on bud/formoterol   Chief Complaint  Patient presents with   Follow-up    He is doing well with his breathing and medications are working well.    Dyspnea:  MMRC2 = can't walk a nl pace on a flat grade s sob but does fine slow and flat eg  Cough: none  Sleeping: fine  SABA use: none 02: none     No obvious day to day or daytime variability or assoc excess/ purulent sputum or mucus plugs or hemoptysis or cp or chest tightness, subjective wheeze or overt sinus or hb symptoms.   Sleeping  without nocturnal  or early am exacerbation  of respiratory  c/o's or need for noct saba. Also denies any obvious fluctuation of symptoms with weather or environmental changes or other aggravating or alleviating factors except as outlined above   No unusual exposure hx or h/o childhood pna/ asthma or knowledge of premature birth.  Current Allergies, Complete Past Medical History, Past Surgical History, Family History, and Social History were reviewed in [employer] record.  ROS  The following are not active complaints unless bolded Hoarseness, sore throat, dysphagia, dental problems, itching, sneezing,  nasal congestion or discharge of excess mucus or purulent secretions, ear ache,   fever, chills, sweats, unintended wt loss or wt gain, classically pleuritic or exertional cp,  orthopnea pnd or arm/hand swelling  or leg swelling, presyncope, palpitations, abdominal pain, anorexia, nausea, vomiting, diarrhea  or change in bowel habits or change in bladder habits, change in stools or change in urine, dysuria, hematuria,  rash, arthralgias, visual complaints, headache, numbness, weakness or ataxia or problems with walking or coordination,  change in mood  or  memory.        Current Meds  Medication Sig   Accu-Chek FastClix Lancets MISC BLOOD GLUCOSE TESTING FASTING ALTERNATING WITH TWO HOURS AFTER SUPPER   albuterol (VENTOLIN HFA) 108 (90 Base) MCG/ACT inhaler Inhale 1-2 puffs into the lungs every 6 (six) hours as needed for wheezing or shortness of breath.   amLODipine (NORVASC) 10 MG tablet Take 1 tablet by mouth daily.   BAYER ASPIRIN PO Take 81 mg by mouth.   budesonide (PULMICORT) 0.25 MG/2ML nebulizer solution Take 2 mLs (0.25 mg total) by nebulization in the morning and at bedtime.   chlorhexidine (PERIDEX) 0.12 % solution 2 (two) times daily.   Cholecalciferol 125 MCG (5000 UT) capsule Take 5,000 Units by mouth daily.   cyclobenzaprine (FLEXERIL) 10 MG tablet Take by mouth.   Ferrous Sulfate (IRON PO) Take 1 tablet by mouth daily.   fluticasone (FLONASE) 50 MCG/ACT nasal spray Place 1 spray into both nostrils daily.   formoterol (PERFOROMIST) 20 MCG/2ML nebulizer solution Take 2 mLs (20 mcg total) by nebulization 2 (two) times daily.   gabapentin (NEURONTIN) 300 MG capsule Take 1 capsule by mouth daily.   ibuprofen (ADVIL) 800 MG tablet Take 800 mg by mouth every 8 (eight) hours as needed.   ipratropium-albuterol (DUONEB) 0.5-2.5 (3) MG/3ML SOLN Take 3 mLs by nebulization every 4 (four) hours as needed. 1 vial in neb every 6 hours as needed   ketorolac (ACULAR)  0.5 % ophthalmic solution Place 1 drop into the left eye 4 (four) times daily.   losartan-hydrochlorothiazide (HYZAAR) 50-12.5 MG tablet Take 1 tablet by mouth daily.   LYCOPENE PO Take 1 tablet by mouth daily.   meloxicam (MOBIC) 15 MG tablet Take 1 tablet by mouth daily.   metoprolol succinate (TOPROL-XL) 25 MG 24 hr tablet TAKE 1 TABLET BY MOUTH EVERY DAY   montelukast (SINGULAIR) 10 MG tablet Take by mouth.   Nebulizers (SIDESTREAM PLUS NEBULIZER) MISC Use with nebulized medications as directed. Replace tubing every 6 months.   prednisoLONE acetate (PRED FORTE) 1 %  ophthalmic suspension Place 1 drop into the left eye 4 (four) times daily.   rosuvastatin (CRESTOR) 10 MG tablet Take 20 mg by mouth every evening.   sildenafil (REVATIO) 20 MG tablet SMARTSIG:1-2 Tablet(s) By Mouth   tamsulosin (FLOMAX) 0.4 MG CAPS capsule Take 0.4 mg by mouth daily.                 Objective:   Physical Exam   Wts  12/25/2021         173   02/17/2021      179  04/10/2020           189 04/10/2019           193  04/10/2018           189  07/07/2017           184  04/08/2017          180  03/11/2017       177   01/20/17 176 lb (79.8 kg)  08/08/16 165 lb (74.8 kg)  10/28/15 160 lb (72.6 kg)     Vital signs reviewed  12/25/2021  - Note at rest 02 sats  97% on RA   General appearance:    ***   Edentulous     Mod bar ***   .             Assessment:

## 2021-12-25 NOTE — Patient Instructions (Signed)
No change medications   Please schedule a follow up visit in 12  months but call sooner if needed    

## 2021-12-26 ENCOUNTER — Encounter: Payer: Self-pay | Admitting: Internal Medicine

## 2021-12-26 NOTE — Assessment & Plan Note (Addendum)
Quit smoking 1994 01/20/2017  After extensive coaching HFA effectiveness =    75% from a baseline of near zero - 01/20/2017 try dulera 100 2bid and off acei   - Spirometry 03/11/2017  FEV1 0.52 (17%)  Ratio 40   - 03/11/2017    try symb 160 x 6 week free trial then ov > improved - Spirometry 04/08/2017  FEV1 1.66 (55%)  Ratio 61 p am symb 160 x 2  - 07/07/2017  After extensive coaching inhaler device  effectiveness =    75% (Ti too short)  - 10/07/2017  PFTs   FEV1 2.11(74%), Ratio 69, DLCO 60 - 04/10/2019   try breztri 2bid - 04/10/2020  After extensive coaching inhaler device,  effectiveness =    75% (short Ti) rec continue breztri unless problem with urination > change to symb 160  - 02/17/2021 changed to performist/ bud 0.25 mg bid as could not use HFA and can't do LAMA due to prostate issues    Group D (now reclassified as E) in terms of symptom/risk and laba/lama/ICS  therefore appropriate rx at this point >>>  Continue laba/ics as can't tol lama plus approp saba  Re SABA :  I spent extra time with pt today reviewing appropriate use of albuterol for prn use on exertion with the following points: 1) saba is for relief of sob that does not improve by walking a slower pace or resting but rather if the pt does not improve after trying this first. 2) If the pt is convinced, as many are, that saba helps recover from activity faster then it's easy to tell if this is the case by re-challenging : ie stop, take the inhaler, then p 5 minutes try the exact same activity (intensity of workload) that just caused the symptoms and see if they are substantially diminished or not after saba 3) if there is an activity that reproducibly causes the symptoms, try the saba 15 min before the activity on alternate days   If in fact the saba really does help, then fine to continue to use it prn but advised may need to look closer at the maintenance regimen being used to achieve better control of airways disease with  exertion.          Each maintenance medication was reviewed in detail including emphasizing most importantly the difference between maintenance and prns and under what circumstances the prns are to be triggered using an action plan format where appropriate.  Total time for H and P, chart review, counseling, reviewing neb device(s) and generating customized AVS unique to this office visit / same day charting = 23 min annual visit

## 2022-01-25 ENCOUNTER — Ambulatory Visit: Payer: Medicare Other | Admitting: Podiatry

## 2022-02-01 ENCOUNTER — Encounter: Payer: Self-pay | Admitting: Podiatry

## 2022-02-01 ENCOUNTER — Ambulatory Visit (INDEPENDENT_AMBULATORY_CARE_PROVIDER_SITE_OTHER): Payer: Medicare Other | Admitting: Podiatry

## 2022-02-01 DIAGNOSIS — M79675 Pain in left toe(s): Secondary | ICD-10-CM

## 2022-02-01 DIAGNOSIS — B351 Tinea unguium: Secondary | ICD-10-CM

## 2022-02-01 DIAGNOSIS — L84 Corns and callosities: Secondary | ICD-10-CM | POA: Diagnosis not present

## 2022-02-01 DIAGNOSIS — G629 Polyneuropathy, unspecified: Secondary | ICD-10-CM

## 2022-02-01 DIAGNOSIS — M79674 Pain in right toe(s): Secondary | ICD-10-CM

## 2022-02-01 DIAGNOSIS — G35 Multiple sclerosis: Secondary | ICD-10-CM | POA: Diagnosis not present

## 2022-02-01 NOTE — Progress Notes (Unsigned)
  Subjective:  Patient ID: Anthony Duffy, male    DOB: 18-Jan-1941,  MRN: [id number]  Anthony Duffy presents to clinic today for  Chief Complaint  Patient presents with   Nail Problem    Nail Trim Not Diabetic  PCP - Cipriano Mile, last OV September 2023  . New problem(s): None. {jgcomplaint:23593}  PCP is Cipriano Mile, NP , and last visit was  {Time; dates multiple:15870}.  No Known Allergies  Review of Systems: Negative except as noted in the HPI.  Objective: No changes noted in today's physical examination.  [name] is a pleasant 81 y.o. male {jgbodyhabitus:24098} AAO x 3.  Vascular Examination: Vascular status intact b/l with palpable DP pulses and faintly palpable PT pulses. Pedal hair present b/l. CFT immediate b/l. No edema. No pain with calf compression b/l. Skin temperature gradient WNL b/l.   Neurological Examination: Sensation grossly intact b/l with 10 gram monofilament. Vibratory sensation intact b/l. Pt has subjective symptoms of neuropathy.  Dermatological Examination: Pedal skin with normal turgor, texture and tone b/l. Toenails 1-5 b/l thick, discolored, elongated with subungual debris and pain on dorsal palpation.   Hyperkeratotic lesion(s) dorsal DIPJ of right 4th digit and left 5th digit.  No erythema, no edema, no drainage, no fluctuance.  Musculoskeletal Examination: Muscle strength 5/5 to b/l LE. Limited joint ROM to the 1st MPJ b/l. HAV with bunion bilaterally and hammertoes 2-5 b/l.  Radiographs: None Assessment/Plan: No diagnosis found.  No orders of the defined types were placed in this encounter.   {Jgplan:23602::"-Patient/POA to call should there be question/concern in the interim."}   Return in about 3 months (around 05/04/2022).  Marzetta Board, DPM

## 2022-02-23 ENCOUNTER — Other Ambulatory Visit: Payer: Self-pay | Admitting: Internal Medicine

## 2022-03-11 ENCOUNTER — Other Ambulatory Visit: Payer: Self-pay | Admitting: Internal Medicine

## 2022-03-15 MED ORDER — INNOSPIRE REPLACEMENT FILTER MISC: 5 refills | Status: AC

## 2022-03-15 NOTE — Addendum Note (Signed)
Addended byOralia Rud M on: 03/15/2022 01:39 PM   Modules accepted: Orders

## 2022-05-28 ENCOUNTER — Ambulatory Visit (INDEPENDENT_AMBULATORY_CARE_PROVIDER_SITE_OTHER): Payer: 59 | Admitting: Podiatry

## 2022-05-28 DIAGNOSIS — Z91199 Patient's noncompliance with other medical treatment and regimen due to unspecified reason: Secondary | ICD-10-CM

## 2022-05-30 NOTE — Progress Notes (Signed)
1. No-show for appointment

## 2023-03-05 ENCOUNTER — Other Ambulatory Visit: Payer: Self-pay | Admitting: Internal Medicine

## 2023-05-07 ENCOUNTER — Other Ambulatory Visit: Payer: Self-pay | Admitting: Internal Medicine

## 2023-06-23 ENCOUNTER — Encounter (HOSPITAL_COMMUNITY): Payer: Self-pay | Admitting: Emergency Medicine

## 2023-06-23 ENCOUNTER — Ambulatory Visit (HOSPITAL_COMMUNITY)
Admission: EM | Admit: 2023-06-23 | Discharge: 2023-06-23 | Disposition: A | Discharge status: Home / Self Care | Attending: Nurse Practitioner | Admitting: Nurse Practitioner

## 2023-06-23 DIAGNOSIS — Z202 Contact with and (suspected) exposure to infections with a predominantly sexual mode of transmission: Secondary | ICD-10-CM | POA: Diagnosis present

## 2023-06-23 DIAGNOSIS — Z7251 High risk heterosexual behavior: Secondary | ICD-10-CM | POA: Insufficient documentation

## 2023-06-23 LAB — HIV ANTIBODY (ROUTINE TESTING W REFLEX): HIV Screen 4th Generation wRfx: NONREACTIVE

## 2023-06-23 MED ORDER — METRONIDAZOLE 500 MG PO TABS
2000.0000 mg | ORAL_TABLET | Freq: Once | ORAL | 0 refills | Status: AC
Start: 2023-06-23 — End: 2023-06-23

## 2023-06-23 NOTE — ED Triage Notes (Signed)
 Pt reports sexual partner was positive for Trich. Pt denies any s/s.

## 2023-06-23 NOTE — ED Provider Notes (Signed)
 [facility]    CSN: [id number] Arrival date & time: 06/23/23  0846      History   Chief Complaint Chief Complaint  Patient presents with   Exposure to STD    HPI Anthony Duffy is a 83 y.o. male.   Patient presents today with concern for exposure to trichomonas.  Reports he had unprotected sexual intercourse approximately 10 days ago and the partner has told him she tested positive for trichomonas.  Patient denies any penile discharge, abdominal pain, nausea/vomiting, swelling in the groin, pain in the groin, testicular pain, and rashes/sores/bumps on his penis or genitalia.  He thinks he may have had some dysuria but is not sure.  He is requesting STI testing today.    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 01/20/2017   Essential hypertension 01/20/2017   Multiple sclerosis (HCC) 02/19/2015   Malignant neoplasm of prostate (HCC) 12/03/2014    Past Surgical History:  Procedure Laterality Date   CARDIAC SURGERY     CATARACT EXTRACTION W/PHACO  01/05/2012   Procedure: CATARACT EXTRACTION PHACO AND INTRAOCULAR LENS PLACEMENT (IOC);  Surgeon: Shade Flood, MD;  Location: [facility] OR;  Service: Ophthalmology;  Laterality: Right;   FINGER SURGERY     s/p injury 30 years ago   PROSTATE BIOPSY  10/14/2014       Home Medications    Prior to Admission medications   Medication Sig Start Date End Date Taking? Authorizing Provider  metroNIDAZOLE (FLAGYL) 500 MG tablet Take 4 tablets (2,000 mg total) by mouth once for 1 dose. 06/23/23 06/23/23 Yes Valentino Nose, NP  Accu-Chek FastClix Lancets MISC BLOOD GLUCOSE TESTING FASTING ALTERNATING WITH TWO  HOURS AFTER SUPPER 07/14/18   [provider]  albuterol (VENTOLIN HFA) 108 (90 Base) MCG/ACT inhaler Inhale 1-2 puffs into the lungs every 6 (six) hours as needed for wheezing or shortness of breath. 06/15/21   Cobb, Ruby Cola, NP  amLODipine (NORVASC) 10 MG tablet Take 1 tablet by mouth daily.    [provider]  BAYER ASPIRIN PO Take 81 mg by mouth.    [provider]  budesonide (PULMICORT) 0.25 MG/2ML nebulizer solution Generic for Pulmicort. Inhale one vial in nebulizer twice a day. Rinse mouth after use. 02/24/22   Nyoka Cowden, MD  chlorhexidine (PERIDEX) 0.12 % solution 2 (two) times daily. 05/27/21   [provider]  Cholecalciferol 125 MCG (5000 UT) capsule Take 5,000 Units by mouth daily.    [provider]  cyclobenzaprine (FLEXERIL) 10 MG tablet Take by mouth.    [provider]  Ferrous Sulfate (IRON PO) Take 1 tablet by mouth daily.    [provider]  fluticasone (FLONASE) 50 MCG/ACT nasal spray Place 1 spray into both nostrils daily. 06/20/20   Orvil Feil, PA-C  formoterol (PERFOROMIST) 20 MCG/2ML nebulizer solution Inhale one vial in nebulizer twice a day. 02/24/22   Nyoka Cowden, MD  gabapentin (NEURONTIN) 300 MG capsule Take 1 capsule by mouth daily.    [provider]  ibuprofen (ADVIL) 800 MG tablet Take 800 mg by mouth  every 8 (eight) hours as needed. 05/19/21   [provider]  ipratropium-albuterol (DUONEB) 0.5-2.5 (3) MG/3ML SOLN Take 3 mLs by nebulization every 4 (four) hours as needed. 1 vial in neb every 6 hours as needed 02/18/21   Nyoka Cowden, MD  ketorolac (ACULAR) 0.5 % ophthalmic solution Place 1 drop into the left eye 4 (four) times daily. 09/17/21   [provider]  losartan-hydrochlorothiazide (HYZAAR) 50-12.5 MG tablet Take 1 tablet by mouth daily. 01/20/17   Nyoka Cowden, MD  LYCOPENE PO Take 1 tablet by mouth daily.    [provider]  meloxicam  (MOBIC) 15 MG tablet Take 1 tablet by mouth daily.    [provider]  metoprolol succinate (TOPROL-XL) 25 MG 24 hr tablet TAKE 1 TABLET BY MOUTH EVERY DAY 06/16/20   Patwardhan, Manish J, MD  montelukast (SINGULAIR) 10 MG tablet Take by mouth.    [provider]  Nebulizers (SIDESTREAM PLUS NEBULIZER) MISC Use with nebulized medications as directed. Replace tubing every 6 months. 06/01/21   Nyoka Cowden, MD  prednisoLONE acetate (PRED FORTE) 1 % ophthalmic suspension Place 1 drop into the left eye 4 (four) times daily. 09/17/21   [provider]  Respiratory Therapy Supplies [facility] REPLACEMENT FILTER) MISC Use as directed in nebulizer device. Change every 2 - 3 months. 03/15/22   Nyoka Cowden, MD  rosuvastatin (CRESTOR) 10 MG tablet Take 20 mg by mouth every evening. 05/09/17   [provider]  sildenafil (REVATIO) 20 MG tablet SMARTSIG:1-2 Tablet(s) By Mouth 03/16/21   [provider]  tamsulosin (FLOMAX) 0.4 MG CAPS capsule Take 0.4 mg by mouth daily. 05/27/15   [provider]    Family History Family History  Problem Relation Age of Onset   Cancer Mother        unknown   Cancer Cousin        unknown    Social History Social History   Tobacco Use   Smoking status: Former    Current packs/day: 0.00    Average packs/day: 0.5 packs/day for 30.0 years (15.0 ttl pk-yrs)    Types: Cigarettes    Start date: 04/05/1962    Quit date: 04/05/1992    Years since quitting: 31.2   Smokeless tobacco: Never  Vaping Use   Vaping status: Never Used  Substance Use Topics   Alcohol use: No    Comment: stopped 15 years ago   Drug use: No     Allergies   Patient has no known allergies.   Review of Systems Review of Systems Per HPI  Physical Exam Triage Vital Signs ED Triage Vitals  Encounter Vitals Group     BP 06/23/23 0910 (!) 148/76     Systolic BP Percentile --      Diastolic BP Percentile --      Pulse Rate 06/23/23 0910  71     Resp 06/23/23 0910 17     Temp 06/23/23 0910 97.9 F (36.6 C)     Temp Source 06/23/23 0910 Oral     SpO2 06/23/23 0910 96 %     Weight --      Height --      Head Circumference --      Peak Flow --      Pain Score 06/23/23 0909 0     Pain Loc --      Pain Education --      Exclude from Growth Chart --  No data found.  Updated Vital Signs BP (!) 148/76 (BP Location: Left Arm)   Pulse 71   Temp 97.9 F (36.6 C) (Oral)   Resp 17   SpO2 96%   Visual Acuity Right Eye Distance:   Left Eye Distance:   Bilateral Distance:    Right Eye Near:   Left Eye Near:    Bilateral Near:     Physical Exam Vitals and nursing note reviewed. Exam conducted with a chaperone present Eliezer Lofts, Charity fundraiser).  Constitutional:      General: He is not in acute distress.    Appearance: Normal appearance. He is not toxic-appearing.  Pulmonary:     Effort: Pulmonary effort is normal. No respiratory distress.  Genitourinary:    Pubic Area: No rash.      Penis: Normal and uncircumcised.      Testes: Normal.     Epididymis:     Right: Normal.     Left: Normal.  Lymphadenopathy:     Lower Body: No right inguinal adenopathy. No left inguinal adenopathy.  Skin:    General: Skin is warm and dry.     Capillary Refill: Capillary refill takes less than 2 seconds.     Coloration: Skin is not jaundiced or pale.  Neurological:     Mental Status: He is alert and oriented to person, place, and time.     Motor: No weakness.     Gait: Gait normal.  Psychiatric:        Behavior: Behavior is cooperative.      UC Treatments / Results  Labs (all labs ordered are listed, but only abnormal results are displayed) Labs Reviewed  RPR  HIV ANTIBODY (ROUTINE TESTING W REFLEX)  CYTOLOGY, (ORAL, ANAL, URETHRAL) ANCILLARY ONLY    EKG   Radiology No results found.  Procedures Procedures (including critical care time)  Medications Ordered in UC Medications - No data to display  Initial  Impression / Assessment and Plan / UC Course  I have reviewed the triage vital signs and the nursing notes.  Pertinent labs & imaging results that were available during my care of the patient were reviewed by me and considered in my medical decision making (see chart for details).   Patient is well-appearing, normotensive, afebrile, not tachycardic, not tachypneic, oxygenating well on room air.    1. Trichomonas exposure 2. Unprotected sexual intercourse STI testing performed; HIV and RPR is also pending Will treat with metronidazole 2 g once given possible exposure Safe sex practices discussed and condoms given today  The patient was given the opportunity to ask questions.  All questions answered to their satisfaction.  The patient is in agreement to this plan.   Final Clinical Impressions(s) / UC Diagnoses   Final diagnoses:  Trichomonas exposure  Unprotected sexual intercourse     Discharge Instructions      Since you have been exposed to trichomonas, we are treating you for it today with metronidazole.  Take 4 tablets of the metronidazole at once to treat trichomonas.  We are also testing you for gonorrhea and chlamydia and we will contact you if the test comes back positive early next week.  You have also been tested for HIV and syphilis and will contact you if this testing comes back positive.  Recommend condom use with every sexual encounter going forward.   ED Prescriptions     Medication Sig Dispense Auth. Provider   metroNIDAZOLE (FLAGYL) 500 MG tablet Take 4 tablets (2,000 mg total)  by mouth once for 1 dose. 4 tablet Valentino Nose, NP      PDMP not reviewed this encounter.   Valentino Nose, NP 06/23/23 [phone]

## 2023-06-23 NOTE — Discharge Instructions (Addendum)
 Since you have been exposed to trichomonas, we are treating you for it today with metronidazole.  Take 4 tablets of the metronidazole at once to treat trichomonas.  We are also testing you for gonorrhea and chlamydia and we will contact you if the test comes back positive early next week.  You have also been tested for HIV and syphilis and will contact you if this testing comes back positive.  Recommend condom use with every sexual encounter going forward.

## 2023-06-24 LAB — CYTOLOGY, (ORAL, ANAL, URETHRAL) ANCILLARY ONLY
Chlamydia: NEGATIVE
Comment: NEGATIVE
Comment: NEGATIVE
Comment: NORMAL
Neisseria Gonorrhea: NEGATIVE
Trichomonas: POSITIVE — AB

## 2023-06-24 LAB — RPR: RPR Ser Ql: NONREACTIVE
